# Patient Record
Sex: Male | Born: 1975 | Race: White | Hispanic: No | Marital: Married | Smoking: Never smoker
Health system: Southern US, Community
[De-identification: ages and names within clinical notes are randomized; demographics above are authoritative.]

## PROBLEM LIST (undated history)

## (undated) DIAGNOSIS — E079 Disorder of thyroid, unspecified: Secondary | ICD-10-CM

## (undated) DIAGNOSIS — I2510 Atherosclerotic heart disease of native coronary artery without angina pectoris: Secondary | ICD-10-CM

## (undated) DIAGNOSIS — F329 Major depressive disorder, single episode, unspecified: Secondary | ICD-10-CM

## (undated) DIAGNOSIS — F4310 Post-traumatic stress disorder, unspecified: Secondary | ICD-10-CM

## (undated) DIAGNOSIS — F32A Depression, unspecified: Secondary | ICD-10-CM

## (undated) HISTORY — PX: ACHILLES TENDON SURGERY: SHX542

---

## 2016-02-09 ENCOUNTER — Emergency Department (HOSPITAL_BASED_OUTPATIENT_CLINIC_OR_DEPARTMENT_OTHER)
Admission: EM | Admit: 2016-02-09 | Discharge: 2016-02-09 | Disposition: A | Discharge status: Home / Self Care | Attending: Emergency Medicine | Admitting: Emergency Medicine

## 2016-02-09 ENCOUNTER — Emergency Department (HOSPITAL_BASED_OUTPATIENT_CLINIC_OR_DEPARTMENT_OTHER)

## 2016-02-09 ENCOUNTER — Encounter (HOSPITAL_BASED_OUTPATIENT_CLINIC_OR_DEPARTMENT_OTHER): Payer: Self-pay | Admitting: *Deleted

## 2016-02-09 DIAGNOSIS — Y939 Activity, unspecified: Secondary | ICD-10-CM | POA: Insufficient documentation

## 2016-02-09 DIAGNOSIS — M79672 Pain in left foot: Secondary | ICD-10-CM | POA: Insufficient documentation

## 2016-02-09 DIAGNOSIS — F329 Major depressive disorder, single episode, unspecified: Secondary | ICD-10-CM | POA: Diagnosis not present

## 2016-02-09 DIAGNOSIS — Z7982 Long term (current) use of aspirin: Secondary | ICD-10-CM | POA: Diagnosis not present

## 2016-02-09 DIAGNOSIS — I2510 Atherosclerotic heart disease of native coronary artery without angina pectoris: Secondary | ICD-10-CM | POA: Insufficient documentation

## 2016-02-09 DIAGNOSIS — M79605 Pain in left leg: Secondary | ICD-10-CM | POA: Diagnosis present

## 2016-02-09 DIAGNOSIS — M25572 Pain in left ankle and joints of left foot: Secondary | ICD-10-CM | POA: Diagnosis not present

## 2016-02-09 DIAGNOSIS — Z79899 Other long term (current) drug therapy: Secondary | ICD-10-CM | POA: Insufficient documentation

## 2016-02-09 DIAGNOSIS — Y929 Unspecified place or not applicable: Secondary | ICD-10-CM | POA: Insufficient documentation

## 2016-02-09 DIAGNOSIS — W010XXA Fall on same level from slipping, tripping and stumbling without subsequent striking against object, initial encounter: Secondary | ICD-10-CM | POA: Insufficient documentation

## 2016-02-09 DIAGNOSIS — Y999 Unspecified external cause status: Secondary | ICD-10-CM | POA: Insufficient documentation

## 2016-02-09 HISTORY — DX: Depression, unspecified: F32.A

## 2016-02-09 HISTORY — DX: Major depressive disorder, single episode, unspecified: F32.9

## 2016-02-09 HISTORY — DX: Atherosclerotic heart disease of native coronary artery without angina pectoris: I25.10

## 2016-02-09 HISTORY — DX: Disorder of thyroid, unspecified: E07.9

## 2016-02-09 MED ORDER — OXYCODONE-ACETAMINOPHEN 5-325 MG PO TABS
2.0000 | ORAL_TABLET | Freq: Once | ORAL | Status: AC
  Administered 2016-02-09: 2 via ORAL
  Filled 2016-02-09: qty 2

## 2016-02-09 NOTE — ED Notes (Signed)
D/c home with ride. Disc of imaging given to pt for f/u

## 2016-02-09 NOTE — ED Notes (Signed)
Surgery on his left foot a week ago. He slipped on a wet floor today. He fell. Pain in his left foot since the fall.

## 2016-02-09 NOTE — ED Provider Notes (Signed)
CSN: [id number]     Arrival date & time 02/09/16  1417 History   First MD Initiated Contact with Patient 02/09/16 1441     Chief Complaint  Patient presents with  . Fall  . Leg Pain     (Consider location/radiation/quality/duration/timing/severity/associated sxs/prior Treatment) Patient is a 40 y.o. male presenting with fall and leg pain. The history is provided by the patient and medical records.  Fall Associated symptoms include arthralgias.  Leg Pain  40 year old male with history of depression, thyroid disease, coronary artery disease, presenting to the ED for left foot pain. He had Achilles tendon repair 1 week ago secondary to chronic achilles tendonitis with heel spurs.  He states this was not in [place] where he lives. He is currently in [place] interviewing for jobs. He states is at [facility] today and slipped on a wet floor which caused him to bear weight on his left foot which she is not supposed to do yet. He states he has had persistent pain in his left foot since the fall. He denies numbness or weakness of his left foot. He is not taking any of his pain medication thus far today.  Past Medical History  Diagnosis Date  . Depression   . Thyroid disease   . CAD (coronary artery disease)    Past Surgical History  Procedure Laterality Date  . Achilles tendon surgery     No family history on file. Social History  Substance Use Topics  . Smoking status: Never Smoker   . Smokeless tobacco: None  . Alcohol Use: Yes     Comment: occ    Review of Systems  Musculoskeletal: Positive for arthralgias.  All other systems reviewed and are negative.     Allergies  Review of patient's allergies indicates no known allergies.  Home Medications   Prior to Admission medications   Medication Sig Start Date End Date Taking? Authorizing Provider  aspirin 81 MG tablet Take 81 mg by mouth daily.   Yes Historical Provider, MD  Levothyroxine Sodium (SYNTHROID PO) Take by  mouth.   Yes Historical Provider, MD  Oxycodone-Acetaminophen (PERCOCET PO) Take by mouth.   Yes Historical Provider, MD  Rosuvastatin Calcium (CRESTOR PO) Take by mouth.   Yes Historical Provider, MD  Venlafaxine HCl (EFFEXOR XR PO) Take by mouth.   Yes Historical Provider, MD   BP 130/92 mmHg  Pulse 103  Temp(Src) 98.1 F (36.7 C) (Oral)  Resp 18  Ht  (1.803 m)  Wt 120.203 kg  BMI 36.98 kg/m2  SpO2 99%   Physical Exam  Constitutional: He is oriented to person, place, and time. He appears well-developed and well-nourished.  HENT:  Head: Normocephalic and atraumatic.  Mouth/Throat: Oropharynx is clear and moist.  Eyes: Conjunctivae and EOM are normal. Pupils are equal, round, and reactive to light.  Neck: Normal range of motion.  Cardiovascular: Normal rate, regular rhythm and normal heart sounds.   Pulmonary/Chest: Effort normal and breath sounds normal.  Abdominal: Soft. Bowel sounds are normal.  Musculoskeletal: Normal range of motion.  Short leg posterior splint in place-- splint removed revealing posterior ankle with midline incision with vicryl sutures in place; no dehiscence of wound; mild swelling and bruising noted to medial left ankle and foot which appears old (likely from surgery); no deformities or bulges about the posterior ankle or distal posterior calf; pain with any ROM of left ankle; DP pulse intact; normal cap refill, normal sensation throughout  Neurological: He is alert and oriented  to person, place, and time.  Skin: Skin is warm and dry.  Psychiatric: He has a normal mood and affect.  Nursing note and vitals reviewed.   ED Course  ORTHOPEDIC INJURY TREATMENT Date/Time: 02/09/2016 4:08 PM Performed by: Garlon HatchetSANDERS, LISA M Authorized by: Garlon HatchetSANDERS, LISA M Consent: Verbal consent obtained. Risks and benefits: risks, benefits and alternatives were discussed Consent given by: patient Patient understanding: patient states understanding of the procedure being  performed Required items: required blood products, implants, devices, and special equipment available Patient identity confirmed: verbally with patient Injury location: ankle Location details: left ankle Injury type: soft tissue Pre-procedure neurovascular assessment: neurovascularly intact Immobilization: splint Splint type: short leg Supplies used: cotton padding,  elastic bandage and Ortho-Glass Post-procedure neurovascular assessment: post-procedure neurovascularly intact Patient tolerance: Patient tolerated the procedure well with no immediate complications   (including critical care time) Labs Review Labs Reviewed - No data to display  Imaging Review Dg Ankle Complete Left  02/09/2016  CLINICAL DATA:  Left ankle and foot pain after falling wet floor today. Left foot surgery 1 week ago. Initial encounter. EXAM: LEFT ANKLE COMPLETE - 3+ VIEW COMPARISON:  None. FINDINGS: As seen on the accompanying foot radiographs, there is a linear density along the superior aspect of the calcaneal tuberosity which may related to the patient's reported recent surgery. No typical acute fracture or dislocation. The soft tissues around the ankle are mildly prominent. IMPRESSION: No suspected acute osseous findings. Linear density superior to the calcaneal tuberosity, possibly related to recent surgery. Electronically Signed   By: Carey BullocksWilliam  Veazey M.D.   On: 02/09/2016 15:47   Dg Foot Complete Left  02/09/2016  CLINICAL DATA:  Left ankle and foot pain after falling wet floor today. Left foot surgery 1 week ago. Initial encounter. EXAM: LEFT FOOT - COMPLETE 3+ VIEW COMPARISON:  None. FINDINGS: The mineralization and alignment are normal. There is an indeterminate small linear density along the superior aspect of the calcaneal tuberosity which may be related to the patient's reported recent surgery. No typical acute fracture or dislocation. There appears to be mild soft tissue swelling in the dorsum of the  forefoot. IMPRESSION: No suspected acute findings. Linear density superior to the calcaneal tuberosity may be related to the patient's reported recent surgery. Correlate clinically. Electronically Signed   By: Carey BullocksWilliam  Veazey M.D.   On: 02/09/2016 15:46   I have personally reviewed and evaluated these images and lab results as part of my medical decision-making.   EKG Interpretation None      MDM   Final diagnoses:  Left ankle pain  Left foot pain   40 year old male here with left foot pain. He had an Achilles tendon repair 1 week ago with his orthopedic surgeon in [place]. [place] physical or be jobs. He accidentally stepped down on his left foot today while [facility] [employer] [facility] [employer] after slipping and some water. No head injury loss of consciousness. Reports increased pain in his left ankle/heel. He rides in a short-leg posterior splint which was removed. His posterior incision and sutures were pain well approximated, no dehiscence. He does have some swelling and bruising noted which is old and likely from his surgery. He has pain with any attempted range of motion of his left ankle. There is no deformity of the posterior ankle or distal calf. Screening x-rays of left ankle and left foot obtained, no acute findings noted. Patient was placed in a new posterior splint. He has previously scheduled follow-up  with his orthopedist later this week once he returns to [place].  Images copied onto disc and given to patient for physician review.  Discussed plan with patient, he/she acknowledged understanding and agreed with plan of care.  Return precautions given for new or worsening symptoms.  Garlon Hatchet, PA-C 02/09/16 1621  Azalia Bilis, MD 02/10/16 [phone]

## 2016-02-09 NOTE — ED Notes (Signed)
Xray made aware that pt needs a CD of his imaging to take with him at discharge

## 2016-02-09 NOTE — Discharge Instructions (Signed)
Continue your home pain medications as directed.  Follow-up with your orthopedist on Thursday as scheduled.  Your x-rays were uploaded to disc for his review. Return to the ED for new or worsening symptoms.

## 2016-11-10 IMAGING — CR DG ANKLE COMPLETE 3+V*L*
3 series · 3 of 3 positions shown · non-contrast
Comparison: None.

CLINICAL DATA: Left ankle and foot pain after falling wet floor
today. Left foot surgery 1 week ago. Initial encounter.

EXAM:
LEFT ANKLE COMPLETE - 3+ VIEW

[t ankle joint lat left]
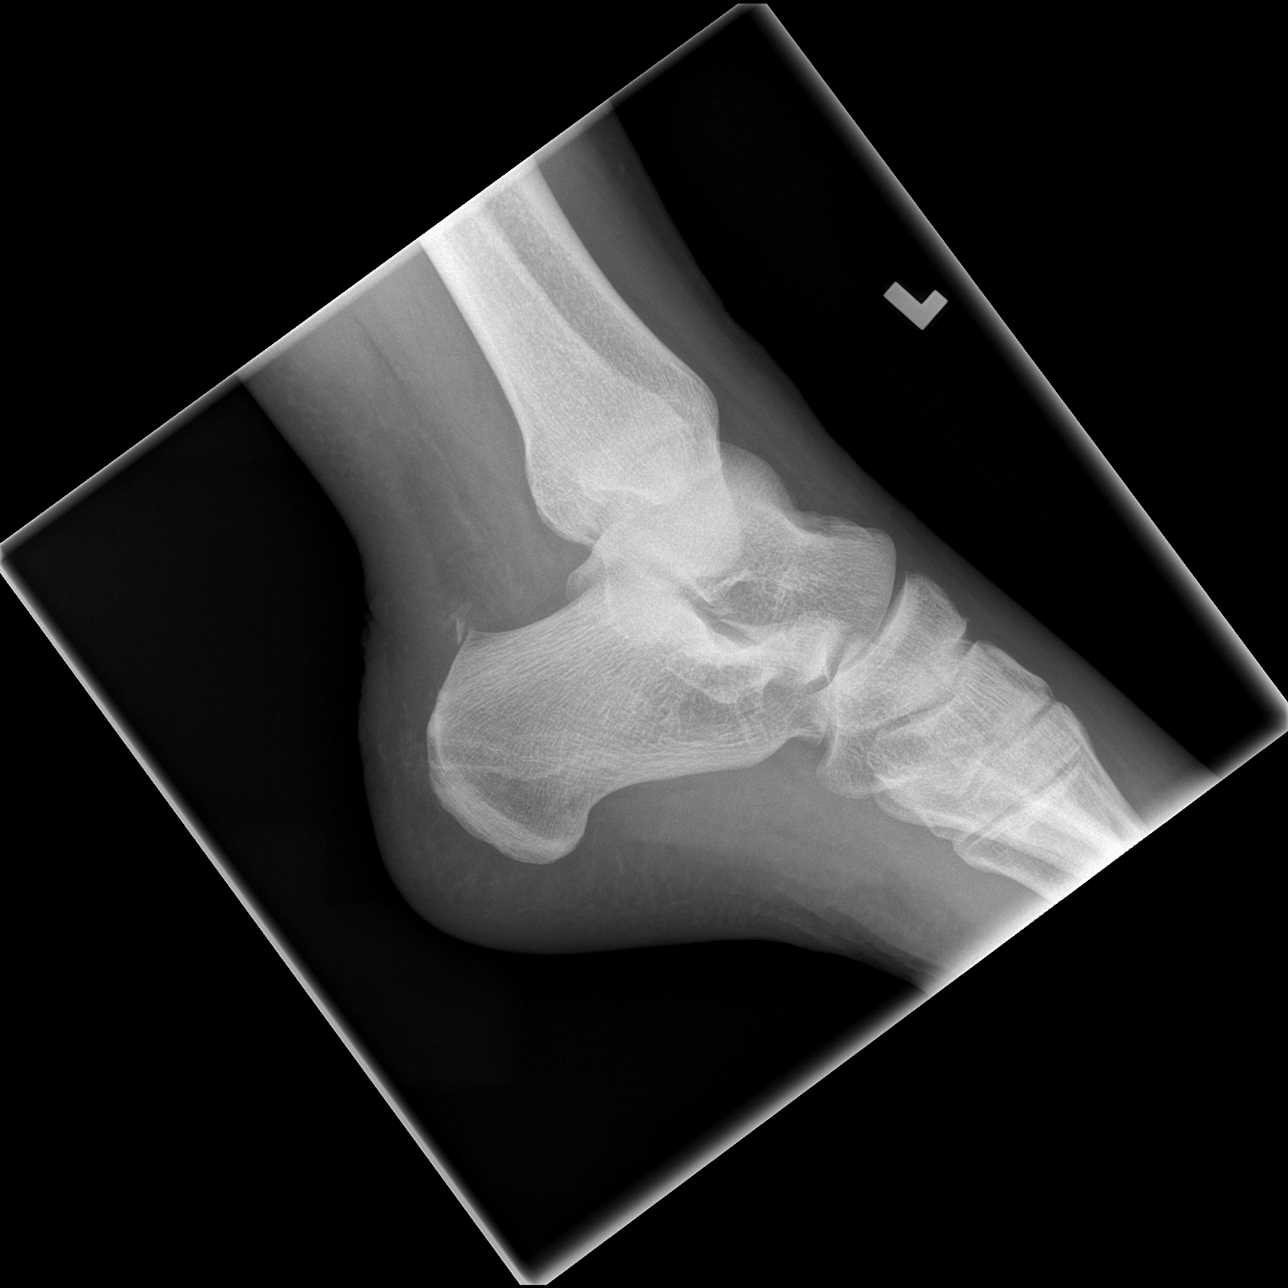

[t ankle joint ap left]
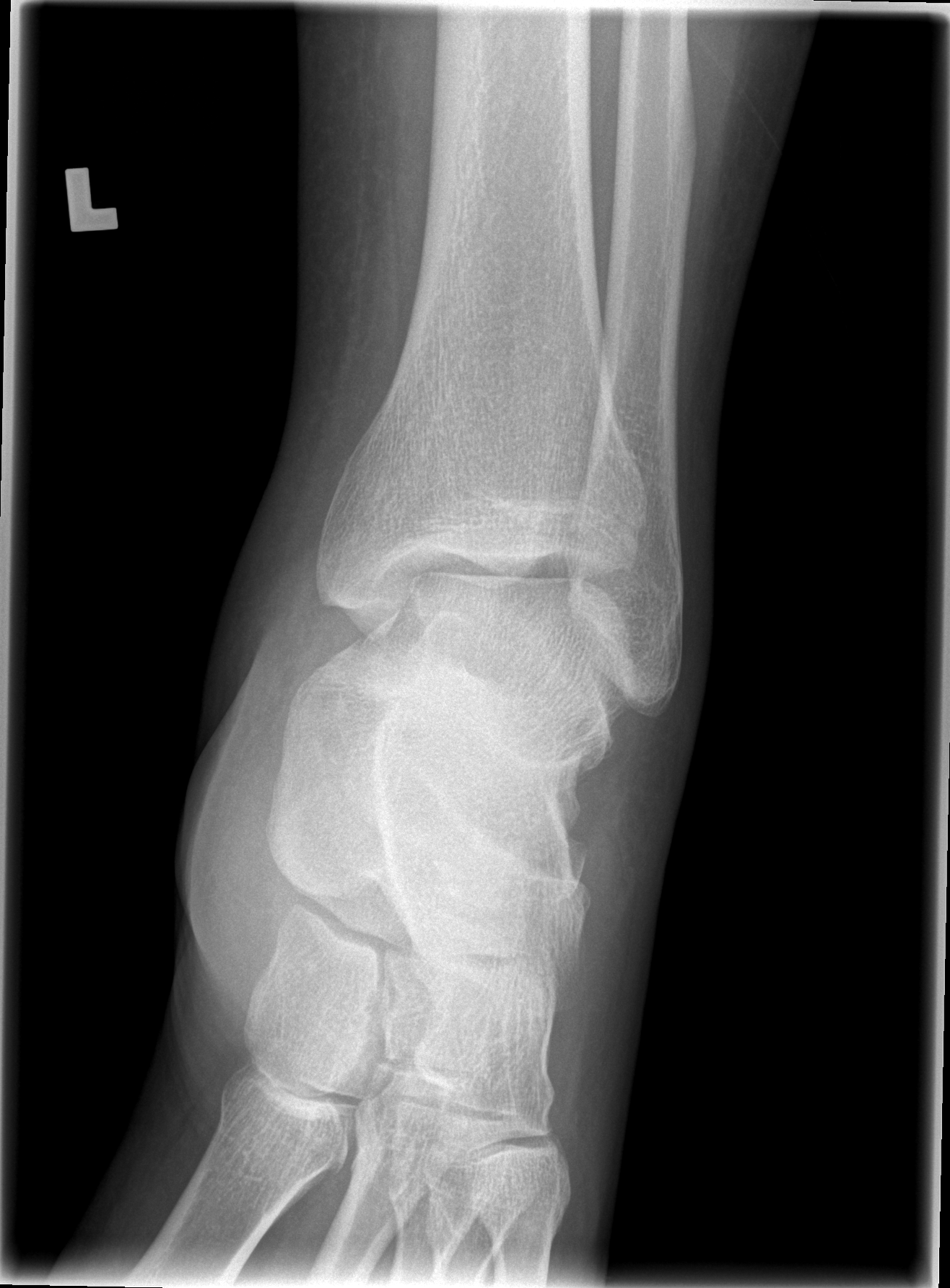

[t ankle joint oblique left]
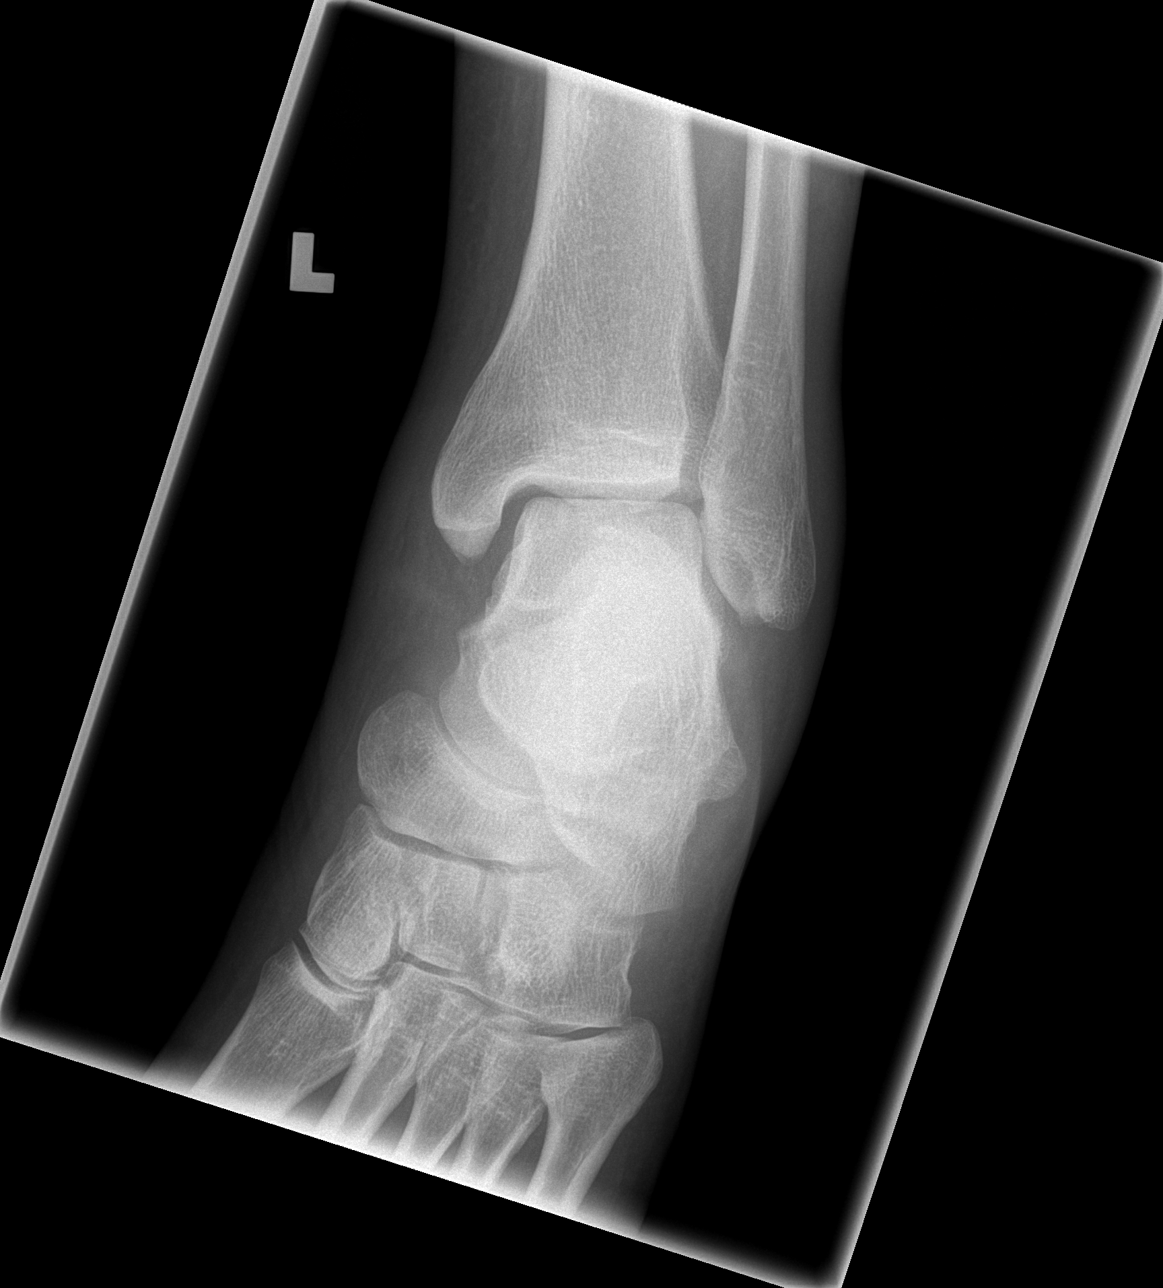

[3 of 3 positions shown; findings below may reference images not displayed]

FINDINGS: As seen on the accompanying foot radiographs, there is a linear
density along the superior aspect of the calcaneal tuberosity which
may related to the patient's reported recent surgery. No typical
acute fracture or dislocation. The soft tissues around the ankle are
mildly prominent.
IMPRESSION: No suspected acute osseous findings. Linear density superior to the
calcaneal tuberosity, possibly related to recent surgery.

## 2018-02-01 ENCOUNTER — Other Ambulatory Visit: Payer: Self-pay

## 2018-02-01 ENCOUNTER — Encounter (HOSPITAL_COMMUNITY): Payer: Self-pay

## 2018-02-01 ENCOUNTER — Emergency Department (HOSPITAL_COMMUNITY)

## 2018-02-01 ENCOUNTER — Encounter (HOSPITAL_COMMUNITY): Payer: Self-pay | Admitting: Emergency Medicine

## 2018-02-01 ENCOUNTER — Inpatient Hospital Stay (HOSPITAL_COMMUNITY)
Admission: AD | Admit: 2018-02-01 | Discharge: 2018-02-06 | DRG: 885 | Disposition: A | Discharge status: Home / Self Care | Source: Intra-hospital | Attending: Psychiatry | Admitting: Psychiatry

## 2018-02-01 ENCOUNTER — Emergency Department (HOSPITAL_COMMUNITY)
Admission: EM | Admit: 2018-02-01 | Discharge: 2018-02-01 | Disposition: A | Discharge status: Other Acute Inpatient Hospital | Attending: Emergency Medicine | Admitting: Emergency Medicine

## 2018-02-01 DIAGNOSIS — R45851 Suicidal ideations: Secondary | ICD-10-CM | POA: Diagnosis present

## 2018-02-01 DIAGNOSIS — R7401 Elevation of levels of liver transaminase levels: Secondary | ICD-10-CM

## 2018-02-01 DIAGNOSIS — F322 Major depressive disorder, single episode, severe without psychotic features: Secondary | ICD-10-CM | POA: Insufficient documentation

## 2018-02-01 DIAGNOSIS — G4700 Insomnia, unspecified: Secondary | ICD-10-CM | POA: Diagnosis not present

## 2018-02-01 DIAGNOSIS — I2510 Atherosclerotic heart disease of native coronary artery without angina pectoris: Secondary | ICD-10-CM | POA: Diagnosis not present

## 2018-02-01 DIAGNOSIS — F419 Anxiety disorder, unspecified: Secondary | ICD-10-CM | POA: Diagnosis not present

## 2018-02-01 DIAGNOSIS — R740 Nonspecific elevation of levels of transaminase and lactic acid dehydrogenase [LDH]: Secondary | ICD-10-CM | POA: Diagnosis not present

## 2018-02-01 DIAGNOSIS — F4310 Post-traumatic stress disorder, unspecified: Secondary | ICD-10-CM | POA: Diagnosis present

## 2018-02-01 DIAGNOSIS — F4010 Social phobia, unspecified: Secondary | ICD-10-CM | POA: Diagnosis not present

## 2018-02-01 DIAGNOSIS — R450 Nervousness: Secondary | ICD-10-CM | POA: Diagnosis not present

## 2018-02-01 DIAGNOSIS — Z79899 Other long term (current) drug therapy: Secondary | ICD-10-CM | POA: Diagnosis not present

## 2018-02-01 DIAGNOSIS — F1099 Alcohol use, unspecified with unspecified alcohol-induced disorder: Secondary | ICD-10-CM | POA: Diagnosis not present

## 2018-02-01 DIAGNOSIS — Z811 Family history of alcohol abuse and dependence: Secondary | ICD-10-CM | POA: Diagnosis not present

## 2018-02-01 DIAGNOSIS — E039 Hypothyroidism, unspecified: Secondary | ICD-10-CM | POA: Diagnosis not present

## 2018-02-01 HISTORY — DX: Post-traumatic stress disorder, unspecified: F43.10

## 2018-02-01 LAB — RAPID URINE DRUG SCREEN, HOSP PERFORMED
AMPHETAMINES: NOT DETECTED
Barbiturates: NOT DETECTED
Benzodiazepines: NOT DETECTED
COCAINE: NOT DETECTED
OPIATES: NOT DETECTED
Tetrahydrocannabinol: NOT DETECTED

## 2018-02-01 LAB — COMPREHENSIVE METABOLIC PANEL
ALT: 87 U/L — ABNORMAL HIGH (ref 17–63)
ANION GAP: 13 (ref 5–15)
AST: 42 U/L — AB (ref 15–41)
Albumin: 4.6 g/dL (ref 3.5–5.0)
Alkaline Phosphatase: 77 U/L (ref 38–126)
BILIRUBIN TOTAL: 0.9 mg/dL (ref 0.3–1.2)
BUN: 17 mg/dL (ref 6–20)
CHLORIDE: 108 mmol/L (ref 101–111)
CO2: 20 mmol/L — ABNORMAL LOW (ref 22–32)
Calcium: 9.5 mg/dL (ref 8.9–10.3)
Creatinine, Ser: 1.02 mg/dL (ref 0.61–1.24)
Glucose, Bld: 97 mg/dL (ref 65–99)
POTASSIUM: 3.8 mmol/L (ref 3.5–5.1)
Sodium: 141 mmol/L (ref 135–145)
TOTAL PROTEIN: 8.4 g/dL — AB (ref 6.5–8.1)

## 2018-02-01 LAB — URINALYSIS, ROUTINE W REFLEX MICROSCOPIC
Bilirubin Urine: NEGATIVE
Glucose, UA: NEGATIVE mg/dL
Hgb urine dipstick: NEGATIVE
Ketones, ur: 5 mg/dL — AB
Leukocytes, UA: NEGATIVE
Nitrite: NEGATIVE
PROTEIN: NEGATIVE mg/dL
SPECIFIC GRAVITY, URINE: 1.023 (ref 1.005–1.030)
pH: 5 (ref 5.0–8.0)

## 2018-02-01 LAB — CBC
HCT: 48.5 % (ref 39.0–52.0)
Hemoglobin: 17.6 g/dL — ABNORMAL HIGH (ref 13.0–17.0)
MCH: 32.1 pg (ref 26.0–34.0)
MCHC: 36.3 g/dL — ABNORMAL HIGH (ref 30.0–36.0)
MCV: 88.3 fL (ref 78.0–100.0)
PLATELETS: 236 10*3/uL (ref 150–400)
RBC: 5.49 MIL/uL (ref 4.22–5.81)
RDW: 13.1 % (ref 11.5–15.5)
WBC: 8.1 10*3/uL (ref 4.0–10.5)

## 2018-02-01 LAB — SALICYLATE LEVEL

## 2018-02-01 LAB — ETHANOL

## 2018-02-01 LAB — ACETAMINOPHEN LEVEL

## 2018-02-01 MED ORDER — ALUM & MAG HYDROXIDE-SIMETH 200-200-20 MG/5ML PO SUSP: 30.0000 mL | ORAL | Status: DC | PRN

## 2018-02-01 MED ORDER — HYDROXYZINE HCL 50 MG PO TABS
50.0000 mg | ORAL_TABLET | Freq: Three times a day (TID) | ORAL | Status: DC
  Administered 2018-02-02 (×2): 50 mg via ORAL
  Filled 2018-02-01 (×6): qty 1

## 2018-02-01 MED ORDER — ACETAMINOPHEN 325 MG PO TABS: 650.0000 mg | ORAL_TABLET | Freq: Four times a day (QID) | ORAL | Status: DC | PRN

## 2018-02-01 MED ORDER — MAGNESIUM HYDROXIDE 400 MG/5ML PO SUSP: 30.0000 mL | Freq: Every day | ORAL | Status: DC | PRN

## 2018-02-01 MED ORDER — TRAZODONE HCL 50 MG PO TABS
50.0000 mg | ORAL_TABLET | Freq: Every evening | ORAL | Status: DC | PRN
  Administered 2018-02-02: 50 mg via ORAL
  Filled 2018-02-01 (×2): qty 1

## 2018-02-01 MED ORDER — IBUPROFEN 600 MG PO TABS: 600.0000 mg | ORAL_TABLET | Freq: Three times a day (TID) | ORAL | Status: DC | PRN

## 2018-02-01 MED ORDER — IBUPROFEN 200 MG PO TABS: 600.0000 mg | ORAL_TABLET | Freq: Three times a day (TID) | ORAL | Status: DC | PRN

## 2018-02-01 MED ORDER — LOPERAMIDE HCL 2 MG PO CAPS
4.0000 mg | ORAL_CAPSULE | Freq: Once | ORAL | Status: AC
  Administered 2018-02-01: 4 mg via ORAL
  Filled 2018-02-01: qty 2

## 2018-02-01 MED ORDER — HYDROXYZINE HCL 25 MG PO TABS
50.0000 mg | ORAL_TABLET | Freq: Three times a day (TID) | ORAL | Status: DC
  Administered 2018-02-01: 50 mg via ORAL
  Filled 2018-02-01: qty 2

## 2018-02-01 MED ORDER — ALUM & MAG HYDROXIDE-SIMETH 200-200-20 MG/5ML PO SUSP: 30.0000 mL | Freq: Four times a day (QID) | ORAL | Status: DC | PRN

## 2018-02-01 MED ORDER — ONDANSETRON HCL 4 MG PO TABS: 4.0000 mg | ORAL_TABLET | Freq: Three times a day (TID) | ORAL | Status: DC | PRN

## 2018-02-01 MED ORDER — LOPERAMIDE HCL 2 MG PO CAPS: 2.0000 mg | ORAL_CAPSULE | Freq: Four times a day (QID) | ORAL | Status: DC | PRN

## 2018-02-01 MED ORDER — LOPERAMIDE HCL 2 MG PO CAPS: 2.0000 mg | ORAL_CAPSULE | Freq: Four times a day (QID) | ORAL | Status: AC | PRN

## 2018-02-01 NOTE — ED Triage Notes (Signed)
Pt brought in by [place] counselor for SI and PTSD that gotten worse over past week and half. Pt plan to shoot himself but doesn't have access to gun per counselor.

## 2018-02-01 NOTE — Progress Notes (Signed)
Richard Lara is a 42 y.o. male Voluntary admitted for suicide ideation with a plan to shoot himself from [facility].  Pt is very fugitive, restless and could not concentrate. Pt stated " I am unable to answer any question right now because  I am having hard time concentrating". Pt did allow staff to do skin search. Pt was walked to the unit and shown his room, fluids and toiletry given. Will continue to monitor.

## 2018-02-01 NOTE — BH Assessment (Signed)
Assessment Note  Richard Lara is an 42 y.o. male. Pt reports SI with a plan to shoot himself. Pt denies HI and AVH. Pt denies previous SI attempts. Pt reports 2 previous hospitalizations for SI. Pt states he has been diagnosed with PTSD but he not receiving mental health treatment. Pt is not prescribed mental health medications. Pt denies SA. Pt states he has support from his wife and 2 children.  Pt is seen at the [facility] for primary care needs.  Dr. Sharma Covert and Jacki Cones, NP recommend inpatient treatment. Pt accepted to [facility].  Diagnosis:  F33.2 MDD  Past Medical History:  Past Medical History:  Diagnosis Date  . CAD (coronary artery disease)   . Depression   . PTSD (post-traumatic stress disorder)   . Thyroid disease     Past Surgical History:  Procedure Laterality Date  . ACHILLES TENDON SURGERY      Family History: No family history on file.  Social History:  reports that he has never smoked. He has never used smokeless tobacco. He reports that he drinks alcohol. He reports that he does not use drugs.  Additional Social History:  Alcohol / Drug Use Pain Medications: please see mar Prescriptions: please see mar Over the Counter: please see mar History of alcohol / drug use?: No history of alcohol / drug abuse Longest period of sobriety (when/how long): NA  CIWA: CIWA-Ar BP: (!) 132/100 Pulse Rate: 76 COWS:    Allergies: No Known Allergies  Home Medications:  (Not in a hospital admission)  OB/GYN Status:  No LMP for male patient.  General Assessment Data Location of Assessment: [facility] ED TTS Assessment: In system Is this a Tele or Face-to-Face Assessment?: Face-to-Face Is this an Initial Assessment or a Re-assessment for this encounter?: Initial Assessment Marital status: Married [place] name: NA Is patient pregnant?: No Pregnancy Status: No Living Arrangements: Spouse/significant other, Children Can pt return to current living arrangement?: Yes Admission Status:  Voluntary Is patient capable of signing voluntary admission?: Yes Referral Source: Self/Family/Friend Insurance type: Tricare     Crisis Care Plan Living Arrangements: Spouse/significant other, Children Legal Guardian: Other:(self) Name of Psychiatrist: NA Name of Therapist: NA  Education Status Is patient currently in school?: No Is the patient employed, unemployed or receiving disability?: Employed  Risk to self with the past 6 months Suicidal Ideation: Yes-Currently Present Has patient been a risk to self within the past 6 months prior to admission? : Yes Suicidal Intent: Yes-Currently Present Has patient had any suicidal intent within the past 6 months prior to admission? : Yes Is patient at risk for suicide?: Yes Suicidal Plan?: Yes-Currently Present Has patient had any suicidal plan within the past 6 months prior to admission? : Yes Specify Current Suicidal Plan: to shoot himself Access to Means: Yes Specify Access to Suicidal Means: Pt states he will find one What has been your use of drugs/alcohol within the last 12 months?: NA Previous Attempts/Gestures: No How many times?: 0 Other Self Harm Risks: NA Triggers for Past Attempts: None known Intentional Self Injurious Behavior: None Family Suicide History: No Recent stressful life event(s): Other (Comment)(work issues) Persecutory voices/beliefs?: No Depression: Yes Depression Symptoms: Insomnia, Tearfulness, Isolating, Loss of interest in usual pleasures, Feeling worthless/self pity, Feeling angry/irritable Substance abuse history and/or treatment for substance abuse?: No Suicide prevention information given to non-admitted patients: Yes  Risk to Others within the past 6 months Homicidal Ideation: No Does patient have any lifetime risk of violence toward others beyond the six months  prior to admission? : No Thoughts of Harm to Others: No Current Homicidal Intent: No Current Homicidal Plan: No Access to  Homicidal Means: No Identified Victim: NA History of harm to others?: No Assessment of Violence: None Noted Violent Behavior Description: NA Does patient have access to weapons?: No Criminal Charges Pending?: No Does patient have a court date: No Is patient on probation?: No  Psychosis Hallucinations: None noted Delusions: None noted  Mental Status Report Appearance/Hygiene: Unremarkable Eye Contact: Fair Motor Activity: Freedom of movement Speech: Logical/coherent Level of Consciousness: Alert Mood: Depressed Affect: Depressed Anxiety Level: None Thought Processes: Coherent, Relevant Judgement: Unimpaired Orientation: Person, Place, Time, Situation Obsessive Compulsive Thoughts/Behaviors: None  Cognitive Functioning Concentration: Normal Memory: Recent Intact, Remote Intact Is patient IDD: No Is patient DD?: No Insight: Fair Impulse Control: Fair Appetite: Poor Have you had any weight changes? : No Change Sleep: Decreased Total Hours of Sleep: 5 Vegetative Symptoms: None  ADLScreening [facility] Assessment Services) Patient's cognitive ability adequate to safely complete daily activities?: Yes Patient able to express need for assistance with ADLs?: Yes Independently performs ADLs?: Yes (appropriate for developmental age)  Prior Inpatient Therapy Prior Inpatient Therapy: Yes Prior Therapy Dates: 2009, 2016 Prior Therapy Facilty/Provider(s): can't remember Reason for Treatment: depression  Prior Outpatient Therapy Prior Outpatient Therapy: Yes Prior Therapy Dates: can't recall Prior Therapy Facilty/Provider(s): can't recall Reason for Treatment: depression Does patient have an ACCT team?: No Does patient have Intensive In-House Services?  : No Does patient have [facility] services? : No Does patient have P4CC services?: No  ADL Screening (condition at time of admission) Patient's cognitive ability adequate to safely complete daily activities?: Yes Is the patient  deaf or have difficulty hearing?: No Does the patient have difficulty seeing, even when wearing glasses/contacts?: No Does the patient have difficulty concentrating, remembering, or making decisions?: No Patient able to express need for assistance with ADLs?: Yes Does the patient have difficulty dressing or bathing?: No Independently performs ADLs?: Yes (appropriate for developmental age) Does the patient have difficulty walking or climbing stairs?: No       Abuse/Neglect Assessment (Assessment to be complete while patient is alone) Abuse/Neglect Assessment Can Be Completed: Yes Physical Abuse: Denies Verbal Abuse: Denies Sexual Abuse: Denies Exploitation of patient/patient's resources: Denies     Merchant navy officer (For Healthcare) Does Patient Have a Medical Advance Directive?: No Would patient like information on creating a medical advance directive?: No - Patient declined    Additional Information 1:1 In Past 12 Months?: No CIRT Risk: No Elopement Risk: No Does patient have medical clearance?: Yes     Disposition:  Disposition Initial Assessment Completed for this Encounter: Yes Disposition of Patient: Admit Type of inpatient treatment program: Adult Patient refused recommended treatment: No  On Site Evaluation by:   Reviewed with Physician:    Emmit Pomfret 02/01/2018 1:36 PM

## 2018-02-01 NOTE — BH Assessment (Signed)
[facility] Assessment Progress Note  Per Juanetta Beets, DO, this pt requires psychiatric hospitalization at this time.  Malva Limes, RN, [facility] assigned pt to [facility] Rm [id number]; [facility] will be ready to receive pt at 16:30.  Pt has signed Voluntary Admission and Consent for Treatment, as well as Consent to Release Information to the [facility], and a notification call has been placed.  Signed forms have been faxed to [facility].  Pt's nurse, Lincoln Maxin, has been notified, and agrees to send original paperwork along with pt via Juel Burrow, and to call report to [phone].  Doylene Canning, [place] Behavioral Health Coordinator [phone]

## 2018-02-01 NOTE — ED Provider Notes (Addendum)
[facility]-EMERGENCY DEPT Provider Note   CSN: [id number] Arrival date & [id number]  1004     History   Chief Complaint Chief Complaint  Patient presents with  . Suicidal  . Post-Traumatic Stress Disorder    HPI Richard Lara is a 42 y.o. male with a history of thyroid disease and bipolar disorder who presents to the emergency department with suicidal ideation and plan.  He is accompanied by a counselor from the [facility] who reports that the patient called a suicide hotline expressing suicidal ideation with intent to shoot himself with a gun.  The patient states that he does not have access to a gun in his home.  The [place] counselor reports that the patient was scheduled for a follow-up appointment with a counselor through their facility for tomorrow.  He reports worsening depressed mood, decreased appetite, insomnia, and psychomotor retardation for the last 1.5 weeks.  He reports that he has intermittently struggled with suicidal ideation over the last few years, but has had no history of suicide attempts.  He denies HI or auditory visual hallucinations.  He has a history of 2 previous inpatient stays around the [place] area and to partial inpatient stays.  During one of these stays, he was diagnosed with bipolar disorder, and was started on Zoloft.  He states this was later changed to Wellbutrin which did not do a good job of managing his highs and lows.  He was switched to several other medications, but his last medication was Klonopin.  He states that he was on the highest dose, but stopped taking the medication around 1.5 years ago.  He reports 2-3 alcoholic beverages 2 to 3 days/week.  He reports this is drastically decreased from when he was discharged from the service 8 to 9 years ago.  He served in [place].  He is a non-smoker.  He denies any recreational or illicit drug use.  He reports that he was diagnosed with thyroid  disease several years ago and took levothyroxine for 6 to 7 years.  This medication was discontinued several years ago by his PCP.  He also endorses chronic nonbloody diarrhea with associated nausea.  He states that he has multiple episodes of diarrhea daily, which is mildly increased over the last few weeks.  He denies abdominal pain, vomiting, fever, chills, chest pain, dyspnea, dysuria, back pain, or rash.  Surgical history includes cholecystectomy.  The history is provided by the patient. No language interpreter was used.    Past Medical History:  Diagnosis Date  . CAD (coronary artery disease)   . Depression   . PTSD (post-traumatic stress disorder)   . Thyroid disease     There are no active problems to display for this patient.   Past Surgical History:  Procedure Laterality Date  . ACHILLES TENDON SURGERY          Home Medications    Prior to Admission medications   Medication Sig Start Date End Date Taking? Authorizing Provider  ibuprofen (ADVIL,MOTRIN) 200 MG tablet Take 800 mg by mouth every 6 (six) hours as needed for headache or mild pain.   Yes [provider]    Family History No family history on file.  Social History Social History   Tobacco Use  . Smoking status: Never Smoker  . Smokeless tobacco: Never Used  Substance Use Topics  . Alcohol use: Yes  . Drug use: No     Allergies   Patient  has no known allergies.   Review of Systems Review of Systems  Constitutional: Negative for appetite change and fever.  HENT: Negative for congestion.   Respiratory: Negative for shortness of breath.   Cardiovascular: Negative for chest pain.  Gastrointestinal: Positive for diarrhea (chronic) and nausea. Negative for abdominal distention, abdominal pain, anal bleeding, blood in stool and vomiting.  Genitourinary: Negative for dysuria.  Musculoskeletal: Negative for back pain.  Skin: Negative for rash.  Allergic/Immunologic: Negative for  immunocompromised state.  Neurological: Negative for headaches.  Psychiatric/Behavioral: Positive for dysphoric mood, sleep disturbance and suicidal ideas. Negative for behavioral problems, confusion and hallucinations. The patient is nervous/anxious.      Physical Exam Updated Vital Signs BP (!) 132/100 (BP Location: Left Arm)   Pulse 76   Temp 97.7 F (36.5 C) (Oral)   Resp 19   SpO2 100%   Physical Exam  Constitutional: He is oriented to person, place, and time. He appears well-developed.  HENT:  Head: Normocephalic.  Eyes: Conjunctivae are normal.  Neck: Neck supple.  Cardiovascular: Normal rate, regular rhythm, normal heart sounds and intact distal pulses. Exam reveals no gallop and no friction rub.  No murmur heard. Pulmonary/Chest: Effort normal. No stridor. No respiratory distress. He has no wheezes. He has no rales. He exhibits no tenderness.  Abdominal: Soft. Bowel sounds are normal. He exhibits no distension and no mass. There is tenderness. There is no rebound and no guarding. No hernia.  Mild diffuse tenderness throughout the abdomen.  Abdomen is soft, nondistended.  No CVA tenderness bilaterally.  Negative Murphy sign.  No peritoneal signs.  Musculoskeletal: Normal range of motion. He exhibits no edema, tenderness or deformity.  Neurological: He is alert and oriented to person, place, and time.  Skin: Skin is warm and dry.  Psychiatric: His speech is normal. Judgment normal. He is slowed. He is not actively hallucinating. Thought content is not paranoid and not delusional. Cognition and memory are normal. He exhibits a depressed mood. He expresses suicidal ideation. He expresses no homicidal ideation. He expresses suicidal plans. He expresses no homicidal plans.  Nursing note and vitals reviewed.    ED Treatments / Results  Labs (all labs ordered are listed, but only abnormal results are displayed) Labs Reviewed  COMPREHENSIVE METABOLIC PANEL - Abnormal; Notable  for the following components:      Result Value   CO2 20 (*)    Total Protein 8.4 (*)    AST 42 (*)    ALT 87 (*)    All other components within normal limits  ACETAMINOPHEN LEVEL - Abnormal; Notable for the following components:   Acetaminophen (Tylenol), Serum <10 (*)    All other components within normal limits  CBC - Abnormal; Notable for the following components:   Hemoglobin 17.6 (*)    MCHC 36.3 (*)    All other components within normal limits  URINALYSIS, ROUTINE W REFLEX MICROSCOPIC - Abnormal; Notable for the following components:   APPearance TURBID (*)    Ketones, ur 5 (*)    Bacteria, UA RARE (*)    All other components within normal limits  ETHANOL  SALICYLATE LEVEL  RAPID URINE DRUG SCREEN, HOSP PERFORMED    EKG None  Radiology No results found.  Procedures Procedures (including critical care time)  Medications Ordered in ED Medications  ibuprofen (ADVIL,MOTRIN) tablet 600 mg (has no administration in time range)  ondansetron (ZOFRAN) tablet 4 mg (has no administration in time range)  alum & mag hydroxide-simeth (MAALOX/MYLANTA)  200-200-20 MG/5ML suspension 30 mL (has no administration in time range)  hydrOXYzine (ATARAX/VISTARIL) tablet 50 mg (50 mg Oral Given 02/01/18 1507)  loperamide (IMODIUM) capsule 2 mg (has no administration in time range)  loperamide (IMODIUM) capsule 4 mg (4 mg Oral Given 02/01/18 1202)     Initial Impression / Assessment and Plan / ED Course  I have reviewed the triage vital signs and the nursing notes.  Pertinent labs & imaging results that were available during my care of the patient were reviewed by me and considered in my medical decision making (see chart for details).  Clinical Course as of Feb 02 1603  Wed Feb 01, 2018  1537 Patient rechecked.  He has had another episode of diarrhea.  Will  order as needed loperamide.  Discussed elevated transaminases on labs.  Patient expressed concerns about elevated labs and his  lack of follow-up with primary care at the [place].  Right upper quadrant ultrasound has been ordered.   [MM]  1604 Spoke with PA Fawze.  She will follow-up on right upper quadrant ultrasound results with patient.   [MM]    Clinical Course User Index [MM] Markeisha Mancias A, PA-C    42 year old male with a history of thyroid disease and bipolar disorder presenting with suicidal ideation and plan.  He also endorses depressed mood, insomnia, and poor appetite for the last few weeks.  Labs notable for AST to ALT 2:1 elevation (42:87), likely secondary to alcohol use, but ALT is greater than AST, which is unusual.  Hemoglobin is minimally elevated at 17.6, likely secondary to mild dehydration.  Loperamide given for diarrhea and Zofran for nausea.  Pt medically cleared at this time. Psych hold orders and PRN orders placed. TTS consulted; please see psych team notes for further documentation of care. Pt stable at time of med clearance.    Spoke with Tom with TTS who reports that the patient has been recommended for inpatient treatment. The patient appears reasonably stabilized for admission and transport considering the current resources, flow, and capabilities available in the ED at this time, and I doubt any other [facility] requiring further screening and/or treatment in the ED prior to admission or transport.  Final Clinical Impressions(s) / ED Diagnoses   Final diagnoses:  Current severe episode of major depressive disorder without psychotic features without prior episode (HCC)  Elevated transaminase level    ED Discharge Orders    None       Barkley Boards, PA-C 02/01/18 1426    Tashi Band A, PA-C 02/01/18 1605    Tegeler, Canary Brim, MD 02/01/18 2008

## 2018-02-01 NOTE — ED Notes (Signed)
Bed: [facility]3 Expected date:  Expected time:  Means of arrival:  Comments: 

## 2018-02-01 NOTE — Tx Team (Signed)
Initial Treatment Plan 02/01/2018 9:01 PM Richard Lara ZOX:[id number]    PATIENT STRESSORS: Medication change or noncompliance Traumatic event   PATIENT STRENGTHS: General fund of knowledge Supportive family/friends   PATIENT IDENTIFIED PROBLEMS: "Depression"  "Anxiety"                    DISCHARGE CRITERIA:  Ability to meet basic life and health needs Improved stabilization in mood, thinking, and/or behavior Verbal commitment to aftercare and medication compliance  PRELIMINARY DISCHARGE PLAN: Attend PHP/IOP Outpatient therapy Return to previous living arrangement  PATIENT/FAMILY INVOLVEMENT: This treatment plan has been presented to and reviewed with the patient, Richard Lara. The patient have been given the opportunity to ask questions and make suggestions.  Tyrone Apple, RN 02/01/2018, 9:01 PM

## 2018-02-01 NOTE — Progress Notes (Addendum)
Pt A & O X4 and is logical on interactions. Per pt "I'm here because of PTSD and I've been thinking of harming myself". Pt Presents guarded with flat affect and depressed mood. Denies HI, AVH and physical pain / distress. Endorsed passive SI with plan to shoot self "that will be the easy and clean way out". States "I'm tired living like this up and down all the time, I want to integrate into society, I want to be comfortable with others". Pt is an Horticulturist, commercial. Reports medication noncompliance related to lack of primary care. Rates his depression 6/10 and anxiety 6/10. Unit orientation done and routines discussed with pt; understanding verbalized. Emotional support and availability provided to pt. Encouraged pt to voice concerns. Safety checks initiated.

## 2018-02-02 DIAGNOSIS — F4010 Social phobia, unspecified: Secondary | ICD-10-CM

## 2018-02-02 DIAGNOSIS — F322 Major depressive disorder, single episode, severe without psychotic features: Principal | ICD-10-CM

## 2018-02-02 DIAGNOSIS — Z811 Family history of alcohol abuse and dependence: Secondary | ICD-10-CM

## 2018-02-02 DIAGNOSIS — F419 Anxiety disorder, unspecified: Secondary | ICD-10-CM

## 2018-02-02 DIAGNOSIS — R450 Nervousness: Secondary | ICD-10-CM

## 2018-02-02 DIAGNOSIS — F1099 Alcohol use, unspecified with unspecified alcohol-induced disorder: Secondary | ICD-10-CM

## 2018-02-02 DIAGNOSIS — R45851 Suicidal ideations: Secondary | ICD-10-CM

## 2018-02-02 MED ORDER — LORAZEPAM 1 MG PO TABS
1.0000 mg | ORAL_TABLET | Freq: Three times a day (TID) | ORAL | Status: DC | PRN
  Filled 2018-02-02: qty 1

## 2018-02-02 MED ORDER — ARIPIPRAZOLE 2 MG PO TABS
2.0000 mg | ORAL_TABLET | Freq: Every day | ORAL | Status: DC
  Administered 2018-02-02 – 2018-02-06 (×5): 2 mg via ORAL
  Filled 2018-02-02 (×8): qty 1

## 2018-02-02 MED ORDER — VENLAFAXINE HCL ER 37.5 MG PO CP24
37.5000 mg | ORAL_CAPSULE | Freq: Every day | ORAL | Status: DC
  Administered 2018-02-02 – 2018-02-03 (×2): 37.5 mg via ORAL
  Filled 2018-02-02 (×5): qty 1

## 2018-02-02 NOTE — Plan of Care (Signed)
Patient was educated on several coping skills to deal with social anxiety and low self esteem, including writing 20 positive "I am" statements to keep on his bathroom mirror at home.

## 2018-02-02 NOTE — Progress Notes (Signed)
Pt is observed in room, seen laying in bed with eyes open. Pt was reluctant to answer some assessment questions with Clinical research associate. Pt repeatedly states "I don't know" while observed holding his head. Medication offered/scheduled and Pt refused. Snacks/fluids offered; fluids accepted. Pt denies SI/HI/AVH/Pain at this time. Pt states he wants to be left alone for now. Pt states he does not want to get into any trouble. Pt states he wants to go to bed early. Will continue with POC.

## 2018-02-02 NOTE — Progress Notes (Signed)
Pt woke up at this time requesting PRN meds for sleep. Trazodone and vistaril offered and accepted.

## 2018-02-02 NOTE — Progress Notes (Signed)
Adult Psychoeducational Group Note  Date:  02/02/2018 Time:  6:53 PM  Group Topic/Focus:  Coping With Mental Health Crisis:   The purpose of this group is to help patients identify strategies for coping with mental health crisis.  Group discusses possible causes of crisis and ways to manage them effectively.  Participation Level:  Active  Participation Quality:  Drowsy  Affect:  Anxious  Cognitive:  Appropriate  Insight: Improving  Engagement in Group:  Distracting  Modes of Intervention:  Discussion  Additional Comments:  Patient was very disoriented in group today, patient was crying and wanted to speak to the MD. Graceann Congress Celcia 02/02/2018, 6:53 PM

## 2018-02-02 NOTE — H&P (Signed)
Psychiatric Admission Assessment Adult  Patient Identification: Richard Lara MRN:  [id number] Date of Evaluation:  02/02/2018 Chief Complaint:  "  depressed, anxious" Principal Diagnosis:  MDD, no psychotic features  Diagnosis:   Patient Active Problem List   Diagnosis Date Noted  . MDD (major depressive disorder), single episode, severe , no psychosis ([place]) [F32.2] 02/01/2018   History of Present Illness: 42 year old married male . Presented to ED voluntarily , with [facility] counselor. Reports worsening depression and anxiety, particularly over the last two weeks. He reports this was partially related to work stressors : " a recent change in my  contract" which led to feeling overwhelmed.  He does report history of chronic mood disorder .  States he has had recent suicidal ideations, with thoughts of shooting self . States " I feel stuck". He endorses neuro-vegetative symptoms as below. Denies psychotic symptoms. In addition to depression, he reports  increased anxiety, mainly described as social anxiety type symptoms, states he feels very anxious when interacting with others, particularly if feeling judged, " like they are going to think something bad about me, or I am going say something that does not come out right". 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   Associated Signs/Symptoms: Depression Symptoms:  depressed mood, anhedonia, insomnia, suicidal thoughts with specific plan, anxiety, loss of energy/fatigue, decreased appetite, (Hypo) Manic Symptoms:  None noted or endorsed Anxiety Symptoms:  Reports increased anxiety, states he has increased anxiety around people, particularly when feeling he is at the center of attention or being judged. Does not endorse agoraphobia.  Psychotic Symptoms:  Denies  PTSD Symptoms: States he has been exposed to wounded people when in [employer], but currently does not endorse symptoms of PTSD. Total Time spent with  patient: 45 minutes  Past Psychiatric History: two prior psychiatric admissions for depression  , most recently 2016 while in the [employer] . Denies history of suicide attempts, denies history of self cutting, denies history of psychosis. States that in the past he has been diagnosed with Bipolar Disorder, but currently is not endorsing any clear history of mania or hypomania. Does endorse history of depression, anxiety, which he states " really started after my deployment overseas " ( 2016). Denies history of violence .   Is the patient at risk to self? Yes.    Has the patient been a risk to self in the past 6 months? Yes.    Has the patient been a risk to self within the distant past? No.  Is the patient a risk to others? No.  Has the patient been a risk to others in the past 6 months? No.  Has the patient been a risk to others within the distant past? No.   Prior Inpatient Therapy:  as above  Prior Outpatient Therapy:  no outpatient treatment at this time  Alcohol Screening: 1. How often do you have a drink containing alcohol?: 2 to 3 times a week 2. How many drinks containing alcohol do you have on a typical day when you are drinking?: 3 or 4 3. How often do you have six or more drinks on one occasion?: Monthly AUDIT-C Score: 6 4. How often during the last year have you found that you were not able to stop drinking once you had started?: Never 5. How often during the last year have you failed to do what was normally expected from you becasue of drinking?: Never 6. How often during the  last year have you needed a first drink in the morning to get yourself going after a heavy drinking session?: Never 7. How often during the last year have you had a feeling of guilt of remorse after drinking?: Monthly 8. How often during the last year have you been unable to remember what happened the night before because you had been drinking?: Never 9. Have you or someone else been injured as a result of  your drinking?: No 10. Has a relative or friend or a doctor or another health worker been concerned about your drinking or suggested you cut down?: No Alcohol Use Disorder Identification Test Final Score (AUDIT): 8 Intervention/Follow-up: Alcohol Education Substance Abuse History in the last 12 months:  Reports prior history of regular drinking, has cut down, but still drinks 2 drinks 3-4 times per week. Denies drug abuse. Consequences of Substance Abuse: Denies  Previous Psychotropic Medications: currently not on any psychiatric medications. States that in the past he has been on Zoloft, which was stopped due to sexual dysfunction, Wellbutrin XL in the past. He states he has not been on any psychiatric medications for more than 2 years . Psychological Evaluations:  No Past Medical History: States he has been diagnosed with Hypothyroidism in the past , but states he was taken off medication . Mildly elevated LFTs , had Abdomen US done 5/15 Past Medical History:  Diagnosis Date  . CAD (coronary artery disease)   . Depression   . PTSD (post-traumatic stress disorder)   . Thyroid disease     Past Surgical History:  Procedure Laterality Date  . ACHILLES TENDON SURGERY     Family History: parents alive, separated, no siblings  Family Psychiatric  History: denies psychiatric illness if family, no suicides in family, an uncle is alcoholic Tobacco Screening:  does not smoke or use tobacco products  Social History: married, has two children ages 35.6, lives at home with them, employed as a Automotive engineer, Animator)- honorable discharge Social History   Substance and Sexual Activity  Alcohol Use Yes     Social History   Substance and Sexual Activity  Drug Use No    Additional Social History: Marital status: Married Number of Years Married: 62 What types of issues is patient dealing with in the relationship?: no marital issues reported Are you sexually active?: Yes What is  your sexual orientation?: heterosexual Has your sexual activity been affected by drugs, alcohol, medication, or emotional stress?: decreased interest Does patient have children?: Yes How many children?: 2 How is patient's relationship with their children?: daugthers ages 43 and 17.  Great relationships.   Allergies:  No Known Allergies Lab Results:  Results for orders placed or performed during the hospital encounter of 02/01/18 (from the past 48 hour(s))  Rapid urine drug screen (hospital performed)     Status: None   Collection Time: 02/01/18 10:49 AM  Result Value Ref Range   Opiates NONE DETECTED NONE DETECTED   Cocaine NONE DETECTED NONE DETECTED   Benzodiazepines NONE DETECTED NONE DETECTED   Amphetamines NONE DETECTED NONE DETECTED   Tetrahydrocannabinol NONE DETECTED NONE DETECTED   Barbiturates NONE DETECTED NONE DETECTED    Comment: (NOTE) DRUG SCREEN FOR MEDICAL PURPOSES ONLY.  IF CONFIRMATION IS NEEDED FOR ANY PURPOSE, NOTIFY LAB WITHIN 5 DAYS. LOWEST DETECTABLE LIMITS FOR URINE DRUG SCREEN Drug Class                     Cutoff (ng/mL) Amphetamine  and metabolites    1000 Barbiturate and metabolites    200 Benzodiazepine                 633 Tricyclics and metabolites     300 Opiates and metabolites        300 Cocaine and metabolites        300 THC                            50 Performed at [facility], [place] [address], [place], [place] [address]   Urinalysis, Routine w reflex microscopic     Status: Abnormal   Collection Time: 02/01/18 10:49 AM  Result Value Ref Range   Color, Urine YELLOW YELLOW   APPearance TURBID (A) CLEAR   Specific Gravity, Urine 1.023 1.005 - 1.030   pH 5.0 5.0 - 8.0   Glucose, UA NEGATIVE NEGATIVE mg/dL   Hgb urine dipstick NEGATIVE NEGATIVE   Bilirubin Urine NEGATIVE NEGATIVE   Ketones, ur 5 (A) NEGATIVE mg/dL   Protein, ur NEGATIVE NEGATIVE mg/dL   Nitrite NEGATIVE NEGATIVE   Leukocytes, UA NEGATIVE NEGATIVE    Bacteria, UA RARE (A) NONE SEEN    Comment: Performed at [facility] [address] [place], [place] [address]  Comprehensive metabolic panel     Status: Abnormal   Collection Time: 02/01/18 10:50 AM  Result Value Ref Range   Sodium 141 135 - 145 mmol/L   Potassium 3.8 3.5 - 5.1 mmol/L   Chloride 108 101 - 111 mmol/L   CO2 20 (L) 22 - 32 mmol/L   Glucose, Bld 97 65 - 99 mg/dL   BUN 17 6 - 20 mg/dL   Creatinine, Ser 1.02 0.61 - 1.24 mg/dL   Calcium 9.5 8.9 - 10.3 mg/dL   Total Protein 8.4 (H) 6.5 - 8.1 g/dL   Albumin 4.6 3.5 - 5.0 g/dL   AST 42 (H) 15 - 41 U/L   ALT 87 (H) 17 - 63 U/L   Alkaline Phosphatase 77 38 - 126 U/L   Total Bilirubin 0.9 0.3 - 1.2 mg/dL   GFR calc non Af Amer >60 >60 mL/min   GFR calc Af Amer >60 >60 mL/min    Comment: (NOTE) The eGFR has been calculated using the CKD EPI equation. This calculation has not been validated in all clinical situations. eGFR's persistently <60 mL/min signify possible Chronic Kidney Disease.    Anion gap 13 5 - 15    Comment: Performed at [facility], [place] [address], [place], [place] [address]  Ethanol     Status: None   Collection Time: 02/01/18 10:50 AM  Result Value Ref Range   Alcohol, Ethyl (B) <10 <10 mg/dL    Comment: (NOTE) Lowest detectable limit for serum alcohol is 10 mg/dL. For medical purposes only. Performed at [facility], [place] [address], [place], [place] [address]   Salicylate level     Status: None   Collection Time: 02/01/18 10:50 AM  Result Value Ref Range   Salicylate Lvl <1.1 2.8 - 30.0 mg/dL    Comment: Performed at [facility], [place] [address] [place], [place] [address]  Acetaminophen level     Status: Abnormal   Collection Time: 02/01/18 10:50 AM  Result Value Ref Range   Acetaminophen (Tylenol), Serum <10 (L) 10 - 30 ug/mL    Comment: (NOTE) Therapeutic concentrations vary significantly. A range  of 10-30 ug/mL   may be an effective concentration for many patients. However, some  are best treated at concentrations outside of this range. Acetaminophen concentrations >150 ug/mL at 4 hours after ingestion  and >50 ug/mL at 12 hours after ingestion are often associated with  toxic reactions. Performed at [facility], [place] [address] [place], [place] [address]   cbc     Status: Abnormal   Collection Time: 02/01/18 10:50 AM  Result Value Ref Range   WBC 8.1 4.0 - 10.5 K/uL   RBC 5.49 4.22 - 5.81 MIL/uL   Hemoglobin 17.6 (H) 13.0 - 17.0 g/dL   HCT 48.5 39.0 - 52.0 %   MCV 88.3 78.0 - 100.0 fL   MCH 32.1 26.0 - 34.0 pg   MCHC 36.3 (H) 30.0 - 36.0 g/dL   RDW 13.1 11.5 - 15.5 %   Platelets 236 150 - 400 K/uL    Comment: Performed at [facility], [place] [address] [place], [place] [address]    Blood Alcohol level:  Lab Results  Component Value Date   ETH <10 09/81/1914    Metabolic Disorder Labs:  No results found for: HGBA1C, MPG No results found for: PROLACTIN No results found for: CHOL, TRIG, HDL, CHOLHDL, VLDL, LDLCALC  Current Medications: Current Facility-Administered Medications  Medication Dose Route Frequency Provider Last Rate Last Dose  . acetaminophen (TYLENOL) tablet 650 mg  650 mg Oral Q6H PRN Ethelene Hal, NP      . alum & mag hydroxide-simeth (MAALOX/MYLANTA) 200-200-20 MG/5ML suspension 30 mL  30 mL Oral Q4H PRN Ethelene Hal, NP      . hydrOXYzine (ATARAX/VISTARIL) tablet 50 mg  50 mg Oral TID Ethelene Hal, NP   50 mg at 02/02/18 0826  . ibuprofen (ADVIL,MOTRIN) tablet 600 mg  600 mg Oral Q8H PRN Ethelene Hal, NP      . loperamide (IMODIUM) capsule 2 mg  2 mg Oral Q6H PRN Ethelene Hal, NP      . magnesium hydroxide (MILK OF MAGNESIA) suspension 30 mL  30 mL Oral Daily PRN Ethelene Hal, NP      . ondansetron [facility]) tablet 4 mg  4 mg Oral Q8H PRN Ethelene Hal, NP      .  traZODone (DESYREL) tablet 50 mg  50 mg Oral QHS PRN Ethelene Hal, NP   50 mg at 02/02/18 0108   PTA Medications: Medications Prior to Admission  Medication Sig Dispense Refill Last Dose  . ibuprofen (ADVIL,MOTRIN) 200 MG tablet Take 800 mg by mouth every 6 (six) hours as needed for headache or mild pain.   01/31/2018 at Unknown time    Musculoskeletal: Strength & Muscle Tone: within normal limits Gait & Station: normal Patient leans: N/A  Psychiatric Specialty Exam: Physical Exam  Review of Systems  Constitutional: Negative.   HENT: Negative.   Eyes: Negative.   Respiratory: Negative.   Cardiovascular: Negative.   Gastrointestinal: Negative.   Genitourinary: Negative.   Musculoskeletal: Negative.   Skin: Negative.   Neurological: Negative for seizures and headaches.  Endo/Heme/Allergies: Negative.   Psychiatric/Behavioral: Positive for depression. The patient is nervous/anxious.   All other systems reviewed and are negative.   Blood pressure 133/90, pulse (!) 102, temperature 98.8 F (37.1 C), temperature source Oral, resp. rate 20, height 5' 11"  (1.803 m), weight 123.4 kg (272 lb), SpO2 100 %.Body mass index is 37.94 kg/m.  General Appearance: Fairly Groomed  Eye  Contact:  Fair  Speech:  Normal Rate  Volume:  Normal  Mood:  Depressed  Affect:  constricted, sad, vaguely anxious   Thought Process:  Linear and Descriptions of Associations: Intact  Orientation:  Other:  fully alert and attentive   Thought Content:  no hallucinations, no delusions, not internally preoccupied  Suicidal Thoughts:  No denies current suicidal or self injurious ideations, and contracts for safety  Homicidal Thoughts:  No  Memory:  recent and remote grossly intact   Judgement:  Fair  Insight:  Fair  Psychomotor Activity:  Normal  Concentration:  Concentration: Good and Attention Span: Good  Recall:  Good  Fund of Knowledge:  Good  Language:  Good  Akathisia:  Negative  Handed:   Right  AIMS (if indicated):     Assets:  Communication Skills Desire for Improvement Resilience  ADL's:  Intact  Cognition:  WNL  Sleep:  Number of Hours: 6.25    Treatment Plan Summary: Daily contact with patient to assess and evaluate symptoms and progress in treatment, Medication management, Plan inpatient treatment and medications as below  Observation Level/Precautions:  15 minute checks  Laboratory: as needed   Psychotherapy: milieu, group therapy    Medications:  Patient presents with severe anxiety, warrants short term BZD management to address.  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    Jenne Campus, MD 5/16/201910:57 AM

## 2018-02-02 NOTE — [facility] Group Notes (Signed)
[facility] LCSW Group Therapy Note  Date/Time: 02/02/18, 1315  Type of Therapy/Topic:  Group Therapy:  Balance in Life  Participation Level:  minimal  Description of Group:    This group will address the concept of balance and how it feels and looks when one is unbalanced. Patients will be encouraged to process areas in their lives that are out of balance, and identify reasons for remaining unbalanced. Facilitators will guide patients utilizing problem- solving interventions to address and correct the stressor making their life unbalanced. Understanding and applying boundaries will be explored and addressed for obtaining  and maintaining a balanced life. Patients will be encouraged to explore ways to assertively make their unbalanced needs known to significant others in their lives, using other group members and facilitator for support and feedback.  Therapeutic Goals: 1. Patient will identify two or more emotions or situations they have that consume much of in their lives. 2. Patient will identify signs/triggers that life has become out of balance:  3. Patient will identify two ways to set boundaries in order to achieve balance in their lives:  4. Patient will demonstrate ability to communicate their needs through discussion and/or role plays  Summary of Patient Progress:Pt shared that family is out of balance in his life.  Pt shared pretty extensively with the group his desire to have some time away from his young children with his wife and the wife's resistance to ever leaving the kids.  Good participation.          Therapeutic Modalities:   Cognitive Behavioral Therapy Solution-Focused Therapy Assertiveness Training  Daleen [place], [place]

## 2018-02-02 NOTE — [facility] Suicide Risk Assessment (Signed)
[facility] Admission Suicide Risk Assessment   Nursing information obtained from:   patient and chart  Demographic factors:   42 year old married male, lives with wife and children, employed  Current Mental Status:   see below Loss Factors:   work related stressors Historical Factors:   depression, anxiety, prior psychiatric admissions for depression Risk Reduction Factors:   resilience   Total Time spent with patient: 45 minutes Principal Problem:  MDD, Severe, No Psychotic Features  Diagnosis:   Patient Active Problem List   Diagnosis Date Noted  . MDD (major depressive disorder), single episode, severe , no psychosis ([facility]) [F32.2] 02/01/2018    Continued Clinical Symptoms:  Alcohol Use Disorder Identification Test Final Score (AUDIT): 8 The "Alcohol Use Disorders Identification Test", Guidelines for Use in Primary Care, Second Edition.  World Science writer [facility]). Score between 0-7:  no or low risk or alcohol related problems. Score between 8-15:  moderate risk of alcohol related problems. Score between 16-19:  high risk of alcohol related problems. Score 20 or above:  warrants further diagnostic evaluation for alcohol dependence and treatment.   CLINICAL FACTORS:  42 year old male, presents to ED voluntarily due to worsening depression, suicidal ideations, increased anxiety, social phobia symptoms.    Psychiatric Specialty Exam: Physical Exam  ROS  Blood pressure 133/90, pulse (!) 102, temperature 98.8 F (37.1 C), temperature source Oral, resp. rate 20, height  (1.803 m), weight 123.4 kg (272 lb), SpO2 100 %.Body mass index is 37.94 kg/m.  See admit note MSE   COGNITIVE FEATURES THAT CONTRIBUTE TO RISK:  Closed-mindedness and Loss of executive function    SUICIDE RISK:   Moderate:  Frequent suicidal ideation with limited intensity, and duration, some specificity in terms of plans, no associated intent, good self-control, limited dysphoria/symptomatology, some  risk factors present, and identifiable protective factors, including available and accessible social support.  PLAN OF CARE: Patient will be admitted to inpatient psychiatric unit for stabilization and safety. Will provide and encourage milieu participation. Provide medication management and maked adjustments as needed.  Will follow daily.    I certify that inpatient services furnished can reasonably be expected to improve the patient's condition.   Craige Cotta, MD 02/02/2018, 11:32 AM
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
## 2018-02-02 NOTE — Progress Notes (Signed)
D: Patient presents tearful, depressed, anxious, guarded. Patient works as a professor of physical therapy and has been having difficulty performing his daily tasks. He struggles with social anxiety, due to feeling like he "always puts my foot in my mouth." He reports when he sits in meetings at work, he blurts out things that he regrets. He describes poor impulse control. Patient was visibly tearful, holding head in his hands during group. Actively having suicidal thoughts, but denies having a plan or intent. Patient reports depression and hopelessness 8/10. Anxiety 8/10. Concentration poor, energy low. Patient slept "fair" last night and received trazodone last night. A: Patient checked q15 min, and checks reviewed. Reviewed medication changes with patient and educated on side effects. Educated patient on importance of attending group therapy sessions and educated on several coping skills. Encouarged participation in milieu through recreation therapy and attending meals with peers. RN pulled patient out of group and offered support and encouragement. Educated on several coping skills for social anxiety.  R: Patient receptive to education on medications, and is medication compliant. Patient attending all group therapy and cafeteria with peers. Patient contracts for safety on the unit. Goal for today "see Dr., get a plan" and to meet this "see a doctor."

## 2018-02-02 NOTE — Progress Notes (Addendum)
Pt is observed in dayroom, eating a snack. Pt denies SI/HI/AVH/Pain at this time. Pt states he felt better today.Pt attended wrap-up group this evening and participated. Pt went to bed early without needing PRNs.Will continue with POC.

## 2018-02-03 DIAGNOSIS — G4700 Insomnia, unspecified: Secondary | ICD-10-CM

## 2018-02-03 LAB — LIPID PANEL
Cholesterol: 176 mg/dL (ref 0–200)
HDL: 45 mg/dL (ref >40)
LDL Cholesterol: 117 mg/dL — ABNORMAL HIGH (ref 0–99)
Total CHOL/HDL Ratio: 3.9 RATIO
Triglycerides: 71 mg/dL (ref <150)
VLDL: 14 mg/dL (ref 0–40)

## 2018-02-03 LAB — TSH: TSH: 5.849 u[IU]/mL — ABNORMAL HIGH (ref 0.350–4.500)

## 2018-02-03 LAB — T4, FREE: FREE T4: 1.14 ng/dL (ref 0.82–1.77)

## 2018-02-03 LAB — HEMOGLOBIN A1C
Hgb A1c MFr Bld: 4.8 % (ref 4.8–5.6)
Mean Plasma Glucose: 91.06 mg/dL

## 2018-02-03 MED ORDER — VENLAFAXINE HCL ER 75 MG PO CP24
75.0000 mg | ORAL_CAPSULE | Freq: Every day | ORAL | Status: DC
  Administered 2018-02-04 – 2018-02-06 (×3): 75 mg via ORAL
  Filled 2018-02-03 (×5): qty 1

## 2018-02-03 NOTE — Progress Notes (Signed)
[facility] MD Progress Note  02/03/2018 1:37 PM Richard Lara  MRN:  [id number] Subjective: Patient states he feels a little better today, still feeling depressed and anxious. Currently denies suicidal ideations.  Does not currently endorse medication side effects. Objective: I have discussed case with treatment team and have met with patient. 42 year old married male, veteran, receives outpatient services at [facility], presented for worsening depression and anxiety. Patient reports partial improvement, but remains depressed, anxious, ruminating , denies suicidal ideations.  He has been visible on unit and going to some groups.  Cooperative on approach. Thus far tolerating medications well. Labs reviewed- TSH 5.84 , HgbA1c 4.8  Patient states that he had been diagnosed with hypothyroidism in the past but that Synthroid have been stopped by his PCP, because it was felt he was doing well. 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 MDD (major depressive disorder), single episode, severe , no psychosis ([place]) [F32.2] 02/01/2018   Total Time spent with patient: 20 minutes  Past Psychiatric History:   Past Medical History:  Past Medical History:  Diagnosis Date  . CAD (coronary artery disease)   . Depression   . PTSD (post-traumatic stress disorder)   . Thyroid disease     Past Surgical History:  Procedure Laterality Date  . ACHILLES TENDON SURGERY     Family History: History reviewed. No pertinent family history. Family Psychiatric  History:  Social History:  Social History   Substance and Sexual Activity  Alcohol Use Yes     Social History   Substance and Sexual Activity  Drug Use No    Social History   Socioeconomic History  . Marital status: Married    Spouse name: Not on file  . Number of children: Not on file  . Years of education: Not on file  . Highest education level: Not on file  Occupational  History  . Not on file  Social Needs  . Financial resource strain: Not on file  . Food insecurity:    Worry: Not on file    Inability: Not on file  . Transportation needs:    Medical: Not on file    Non-medical: Not on file  Tobacco Use  . Smoking status: Never Smoker  . Smokeless tobacco: Never Used  Substance and Sexual Activity  . Alcohol use: Yes  . Drug use: No  . Sexual activity: Not on file  Lifestyle  . Physical activity:    Days per week: Not on file    Minutes per session: Not on file  . Stress: Not on file  Relationships  . Social connections:    Talks on phone: Not on file    Gets together: Not on file    Attends religious service: Not on file    Active member of club or organization: Not on file    Attends meetings of clubs or organizations: Not on file    Relationship status: Not on file  Other Topics Concern  . Not on file  Social History Narrative  . Not on file   Additional Social History:   Sleep: Fair  Appetite:  Fair  Current Medications: Current Facility-Administered Medications  Medication Dose Route Frequency Provider Last Rate Last Dose  . acetaminophen (TYLENOL) tablet 650 mg  650 mg Oral Q6H PRN Ethelene Hal, NP      . alum & mag hydroxide-simeth (MAALOX/MYLANTA) 200-200-20 MG/5ML suspension 30 mL  30 mL Oral Q4H PRN Ethelene Hal, NP      . ARIPiprazole (ABILIFY) tablet 2 mg  2 mg Oral Daily Vontae Court, Myer Peer, MD   2 mg at 02/03/18 0900  . ibuprofen (ADVIL,MOTRIN) tablet 600 mg  600 mg Oral Q8H PRN Ethelene Hal, NP      . loperamide (IMODIUM) capsule 2 mg  2 mg Oral Q6H PRN Ethelene Hal, NP      . LORazepam (ATIVAN) tablet 1 mg  1 mg Oral Q8H PRN Kashayla Ungerer A, MD      . magnesium hydroxide (MILK OF MAGNESIA) suspension 30 mL  30 mL Oral Daily PRN Ethelene Hal, NP      . ondansetron [facility]) tablet 4 mg  4 mg Oral Q8H PRN Ethelene Hal, NP      . traZODone (DESYREL) tablet 50 mg  50  mg Oral QHS PRN Ethelene Hal, NP   50 mg at 02/02/18 0108  . venlafaxine XR (EFFEXOR-XR) 24 hr capsule 37.5 mg  37.5 mg Oral Q breakfast Lun Muro, Myer Peer, MD   37.5 mg at 02/03/18 3785    Lab Results:  Results for orders placed or performed during the hospital encounter of 02/01/18 (from the past 48 hour(s))  TSH     Status: Abnormal   Collection Time: 02/03/18  6:24 AM  Result Value Ref Range   TSH 5.849 (H) 0.350 - 4.500 uIU/mL    Comment: Performed by a 3rd Generation assay with a functional sensitivity of <=0.01 uIU/mL. Performed at [facility], [place] [address], [place], [place] [address]   Hemoglobin A1c     Status: None   Collection Time: 02/03/18  6:24 AM  Result Value Ref Range   Hgb A1c MFr Bld 4.8 4.8 - 5.6 %    Comment: (NOTE) Pre diabetes:          5.7%-6.4% Diabetes:              >6.4% Glycemic control for   <7.0% adults with diabetes    Mean Plasma Glucose 91.06 mg/dL    Comment: Performed at [facility] [address], [place], [place] [address]  Lipid panel     Status: Abnormal   Collection Time: 02/03/18  6:24 AM  Result Value Ref Range   Cholesterol 176 0 - 200 mg/dL   Triglycerides 71 <150 mg/dL   HDL 45 >40 mg/dL   Total CHOL/HDL Ratio 3.9 RATIO   VLDL 14 0 - 40 mg/dL   LDL Cholesterol 117 (H) 0 - 99 mg/dL    Comment:        Total Cholesterol/HDL:CHD Risk Coronary Heart Disease Risk Table                     Men   Women  1/2 Average Risk   3.4   3.3  Average Risk       5.0   4.4  2 X Average Risk   9.6   7.1  3 X Average Risk  23.4   11.0        Use the calculated Patient Ratio above and the CHD Risk Table to determine the patient's CHD Risk.        ATP III CLASSIFICATION (LDL):  <100     mg/dL   Optimal  100-129  mg/dL   Near or Above  Optimal  130-159  mg/dL   Borderline  160-189  mg/dL   High  >190     mg/dL   Very High Performed at [place] [facility] [address],  [place], [place] [address]     Blood Alcohol level:  Lab Results  Component Value Date   ETH <10 02/72/5366    Metabolic Disorder Labs: Lab Results  Component Value Date   HGBA1C 4.8 02/03/2018   MPG 91.06 02/03/2018   No results found for: PROLACTIN Lab Results  Component Value Date   CHOL 176 02/03/2018   TRIG 71 02/03/2018   HDL 45 02/03/2018   CHOLHDL 3.9 02/03/2018   VLDL 14 02/03/2018   LDLCALC 117 (H) 02/03/2018    Physical Findings: AIMS: Facial and Oral Movements Muscles of Facial Expression: None, normal Lips and Perioral Area: None, normal Jaw: None, normal Tongue: None, normal,Extremity Movements Upper (arms, wrists, hands, fingers): None, normal Lower (legs, knees, ankles, toes): None, normal, Trunk Movements Neck, shoulders, hips: None, normal, Overall Severity Severity of abnormal movements (highest score from questions above): None, normal Incapacitation due to abnormal movements: None, normal Patient's awareness of abnormal movements (rate only patient's report): No Awareness, Dental Status Current problems with teeth and/or dentures?: No Does patient usually wear dentures?: No  CIWA:  CIWA-Ar Total: 3 COWS:     Musculoskeletal: Strength & Muscle Tone: within normal limits Gait & Station: normal Patient leans: N/A  Psychiatric Specialty Exam: Physical Exam  ROS  denies headache, no chest pain, no shortness of breath, no vomiting  Blood pressure 131/89, pulse 99, temperature 98.6 F (37 C), temperature source Oral, resp. rate 20, height 5' 11"  (1.803 m), weight 123.4 kg (272 lb), SpO2 100 %.Body mass index is 37.94 kg/m.  General Appearance: Well Groomed  Eye Contact:  Fair  Speech:  Normal Rate  Volume:  Normal  Mood:  depressed, anxious, but states he is feeling somewhat better today  Affect:  remains constricted, less severely anxious   Thought Process:  Linear and Descriptions of Associations: Intact  Orientation:  Full (Time, Place, and  Person)  Thought Content:  no hallucinations , no delusions, not internally preoccupied   Suicidal Thoughts:  No denies suicidal or self injurious ideations, no homicidal or violent ideations  Homicidal Thoughts:  No  Memory:  recent and remote grossly intact   Judgement:  Fair  Insight:  Fair  Psychomotor Activity:  Normal  Concentration:  Concentration: Good and Attention Span: Good  Recall:  good   Fund of Knowledge:  Good  Language:  Good  Akathisia:  Negative  Handed:  Right  AIMS (if indicated):     Assets:  Communication Skills Resilience  ADL's:  Intact  Cognition:  WNL  Sleep:  Number of Hours: 6.75   Assessment - patient remains depressed, anxious, but endorses slight improvement compared to how he felt prior to admission. Does present with less severe anxiety today. Denies suicidal ideations. Currently tolerating Effexor XR, Abilify combination well. TSH mildly elevated- does have a history of prior hypothyroidism diagnosis.   Treatment Plan Summary: Daily contact with patient to assess and evaluate symptoms and progress in treatment, Medication management, Plan inpatient admission and medications as below Encourage group and milieu participation to work on coping skills and symptom reduction Increase Effexor XR to 75 mgrs QDAY for depression and anxiety Continue Abilify 2 mgrs QDAY for depression, antidepressant augmentation strategy Continue Ativan 1 mgr Q 6 hours PRN for anxiety as needed  Continue Trazodone 50 mgr QHS PRN for insomnia as needed Will order T3, T4 to follow up on elevated TSH. Jenne Campus, MD 02/03/2018, 1:37 PM

## 2018-02-03 NOTE — BHH Group Notes (Signed)
LCSW Group Therapy Note 02/03/2018 2:22 PM  Type of Therapy and Topic: Group Therapy: Avoiding Self-Sabotaging and Enabling Behaviors  Participation Level: Active  Description of Group:  In this group, patients will learn how to identify obstacles, self-sabotaging and enabling behaviors, as well as: what are they, why do we do them and what needs these behaviors meet. Discuss unhealthy relationships and how to have positive healthy boundaries with those that sabotage and enable. Explore aspects of self-sabotage and enabling in yourself and how to limit these self-destructive behaviors in everyday life.  Therapeutic Goals: 1. Patient will identify one obstacle that relates to self-sabotage and enabling behaviors 2. Patient will identify one personal self-sabotaging or enabling behavior they did prior to admission 3. Patient will state a plan to change the above identified behavior 4. Patient will demonstrate ability to communicate their needs through discussion and/or role play.   Summary of Patient Progress:  Richard Lara was engaged and participated throughout the group session. Richard Lara reports that his self sabotaging behavior is "being too judgmental". Richard Lara reports that is he is not judgmental of others, then he would not think that others were judging him, which contributes to his paranoia.     Therapeutic Modalities:  Cognitive Behavioral Therapy Person-Centered Therapy Motivational Interviewing   Baldo Daub LCSWA Clinical Social Worker

## 2018-02-03 NOTE — Plan of Care (Signed)
  Problem: Safety: Goal: Ability to disclose and discuss suicidal ideas will improve Outcome: Progressing   Problem: Coping: Goal: Ability to identify and develop effective coping behavior will improve Outcome: Progressing   Problem: Activity: Goal: Interest or engagement in activities will improve Outcome: Progressing D: Pt visible in dayroom on initial contact. A & O X4. Denies SI, HI, AVH and pain; pt was observed actively talking to himself at medication window. Presents guarded / withdrawned with depressed affect and is very anxious / restless and poor eye contact on interactions. Reports he slept fairly well last night with good appetite. Rates his depression 6/10, anxiety 8/10 and hopelessness all 6/10. Pt's goal this shift is to "get my anxiety down". Verbalized difficulties related to "reintegrating into society after being in the [employer] for 20 years; I don't know how to do it, I need help, I need someone to help me with all this, I'm 42 year old and feels like a dinosaurs, lot of things have changed".  A: Introduced self to pt. Emotional support and availability provided to pt. All medications administered with verbal education and effects monitored. Encouraged pt to notified social worker in AM about resources for [employer] and he's in agreement. Safety checks maintained at Q 15 minutes intervals without outburst or self harm gestures thus far.   R: Pt receptive to care. Compliant with medications when offered. Denies adverse drug reactions when assessed this shift. Tolerates all PO intake well. Noted in scheduled unit groups. Off unit for meals, returned without issues. Remains safe on and off unit.

## 2018-02-03 NOTE — Progress Notes (Signed)
Writer has observed patient up in the dayroom watching tv after attending group. Writer spoke with him 1:1 and he reports having had a good day. He was informed of medications available but he declined to take anything. He reports that he is hopeful to to learn about resources for him. 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 He reports that he is going to try and rest on his own tonight without the aid of medication. He denies si/hi/a/v hallucinations. Support given and safety maintained on unit with 15 min checks.

## 2018-02-03 NOTE — Progress Notes (Signed)
Adult Psychoeducational Group Note  Date:  02/03/2018 Time:  9:10 PM  Group Topic/Focus:  Building Self Esteem:   The Focus of this group is helping patients become aware of the effects of self-esteem on their lives, the things they and others do that enhance or undermine their self-esteem, seeing the relationship between their level of self-esteem and the choices they make and learning ways to enhance self-esteem. Wrap-Up Group:   The focus of this group is to help patients review their daily goal of treatment and discuss progress on daily workbooks.  Participation Level:  Active  Participation Quality:  Appropriate and Sharing  Affect:  Appropriate  Cognitive:  Alert and Oriented  Insight: Appropriate and Improving  Engagement in Group:  Developing/Improving and Engaged  Modes of Intervention:  Clarification, Exploration and Support  Additional Comments:  Pt verbalized that he would like to work on a plan for when he gets out of here. Pt verbalized that he wants to be good and healthy. Pt verbalized that balance is an issue.  Jeramy Dimmick, Randal Buba 02/03/2018, 9:10 PM

## 2018-02-03 NOTE — Tx Team (Signed)
Interdisciplinary Treatment and Diagnostic Plan Update  02/03/2018 Time of Session: 10:00am  Richard Lara MRN: [id number]  Principal Diagnosis: <principal problem not specified>  Secondary Diagnoses: Active Problems:   MDD (major depressive disorder), single episode, severe , no psychosis ([place])   Current Medications:  Current Facility-Administered Medications  Medication Dose Route Frequency Provider Last Rate Last Dose  . acetaminophen (TYLENOL) tablet 650 mg  650 mg Oral Q6H PRN Ethelene Hal, NP      . alum & mag hydroxide-simeth (MAALOX/MYLANTA) 200-200-20 MG/5ML suspension 30 mL  30 mL Oral Q4H PRN Ethelene Hal, NP      . ARIPiprazole (ABILIFY) tablet 2 mg  2 mg Oral Daily Cobos, Myer Peer, MD   2 mg at 02/03/18 0900  . ibuprofen (ADVIL,MOTRIN) tablet 600 mg  600 mg Oral Q8H PRN Ethelene Hal, NP      . loperamide (IMODIUM) capsule 2 mg  2 mg Oral Q6H PRN Ethelene Hal, NP      . LORazepam (ATIVAN) tablet 1 mg  1 mg Oral Q8H PRN Cobos, Fernando A, MD      . magnesium hydroxide (MILK OF MAGNESIA) suspension 30 mL  30 mL Oral Daily PRN Ethelene Hal, NP      . ondansetron [facility]) tablet 4 mg  4 mg Oral Q8H PRN Ethelene Hal, NP      . traZODone (DESYREL) tablet 50 mg  50 mg Oral QHS PRN Ethelene Hal, NP   50 mg at 02/02/18 0108  . venlafaxine XR (EFFEXOR-XR) 24 hr capsule 37.5 mg  37.5 mg Oral Q breakfast Cobos, Myer Peer, MD   37.5 mg at 02/03/18 1191   PTA Medications: Medications Prior to Admission  Medication Sig Dispense Refill Last Dose  . ibuprofen (ADVIL,MOTRIN) 200 MG tablet Take 800 mg by mouth every 6 (six) hours as needed for headache or mild pain.   01/31/2018 at Unknown time    Patient Stressors: Medication change or noncompliance Traumatic event  Patient Strengths: General fund of knowledge Supportive family/friends  Treatment Modalities: Medication Management, Group therapy, Case management,  1 to 1  session with clinician, Psychoeducation, Recreational therapy.   Physician Treatment Plan for Primary Diagnosis: <principal problem not specified> Long Term Goal(s): Improvement in symptoms so as ready for discharge Improvement in symptoms so as ready for discharge   Short Term Goals: Ability to identify changes in lifestyle to reduce recurrence of condition will improve Ability to maintain clinical measurements within normal limits will improve Ability to identify changes in lifestyle to reduce recurrence of condition will improve Ability to maintain clinical measurements within normal limits will improve  Medication Management: Evaluate patient's response, side effects, and tolerance of medication regimen.  Therapeutic Interventions: 1 to 1 sessions, Unit Group sessions and Medication administration.  Evaluation of Outcomes: Not Met  Physician Treatment Plan for Secondary Diagnosis: Active Problems:   MDD (major depressive disorder), single episode, severe , no psychosis ([place])  Long Term Goal(s): Improvement in symptoms so as ready for discharge Improvement in symptoms so as ready for discharge   Short Term Goals: Ability to identify changes in lifestyle to reduce recurrence of condition will improve Ability to maintain clinical measurements within normal limits will improve Ability to identify changes in lifestyle to reduce recurrence of condition will improve Ability to maintain clinical measurements within normal limits will improve     Medication Management: Evaluate patient's response, side effects, and tolerance of medication regimen.  Therapeutic Interventions: 1  to 1 sessions, Unit Group sessions and Medication administration.  Evaluation of Outcomes: Not Met   RN Treatment Plan for Primary Diagnosis: <principal problem not specified> Long Term Goal(s): Knowledge of disease and therapeutic regimen to maintain health will improve  Short Term Goals: Ability to verbalize  frustration and anger appropriately will improve, Ability to demonstrate self-control, Ability to disclose and discuss suicidal ideas and Compliance with prescribed medications will improve  Medication Management: RN will administer medications as ordered by provider, will assess and evaluate patient's response and provide education to patient for prescribed medication. RN will report any adverse and/or side effects to prescribing provider.  Therapeutic Interventions: 1 on 1 counseling sessions, Psychoeducation, Medication administration, Evaluate responses to treatment, Monitor vital signs and CBGs as ordered, Perform/monitor CIWA, COWS, AIMS and Fall Risk screenings as ordered, Perform wound care treatments as ordered.  Evaluation of Outcomes: Not Met   LCSW Treatment Plan for Primary Diagnosis: <principal problem not specified> Long Term Goal(s): Safe transition to appropriate next level of care at discharge, Engage patient in therapeutic group addressing interpersonal concerns.  Short Term Goals: Engage patient in aftercare planning with referrals and resources, Increase social support, Increase ability to appropriately verbalize feelings, Increase emotional regulation, Facilitate acceptance of mental health diagnosis and concerns, Facilitate patient progression through stages of change regarding substance use diagnoses and concerns, Identify triggers associated with mental health/substance abuse issues and Increase skills for wellness and recovery  Therapeutic Interventions: Assess for all discharge needs, 1 to 1 time with Social worker, Explore available resources and support systems, Assess for adequacy in community support network, Educate family and significant other(s) on suicide prevention, Complete Psychosocial Assessment, Interpersonal group therapy.  Evaluation of Outcomes: Not Met   Progress in Treatment: Attending groups: Yes. Participating in groups: Yes. Taking medication as  prescribed: Yes. Toleration medication: Yes. Family/Significant other contact made: No, will contact:  CSW will assess for an appropriate collateral contact. if given consent Patient understands diagnosis: Yes. Discussing patient identified problems/goals with staff: Yes. Medical problems stabilized or resolved: Yes. Denies suicidal/homicidal ideation: Yes. Issues/concerns per patient self-inventory: No. Other:  New problem(s) identified: None   New Short Term/Long Term Goal(s): medication stabilization, elimination of SI thoughts, development of comprehensive mental wellness plan.    Patient Goals: "I was in a bad place and needed help. I need to be in a good place to be in a better mentality"   Discharge Plan or Barriers: CSW will continue to assess for an appropriate discharge plan.   Reason for Continuation of Hospitalization: Anxiety Depression Medication stabilization  Estimated Length of Stay: Wednesday, 02/08/18  Attendees: Patient: Latrail Pounders 02/03/2018 1:13 PM  Physician: Dr. Neita Garnet, MD 02/03/2018 1:13 PM  Nursing: Nicoletta Dress.Viona Gilmore, RN 02/03/2018 1:13 PM  RN Care Manager: Rhunette Croft 02/03/2018 1:13 PM  Social Worker: Radonna Ricker, [place] 02/03/2018 1:13 PM  Recreational Therapist: X 02/03/2018 1:13 PM  Other: X 02/03/2018 1:13 PM  Other: X 02/03/2018 1:13 PM  Other:X 02/03/2018 1:13 PM    Scribe for Treatment Team: Marylee Floras, [place] 02/03/2018 1:13 PM

## 2018-02-03 NOTE — Progress Notes (Signed)
Recreation Therapy Notes  Date: 5.17.19 Time: 0930 Location: [address] Dayroom  Group Topic: Stress Management  Goal Area(s) Addresses:  Patient will verbalize importance of using healthy stress management.  Patient will identify positive emotions associated with healthy stress management.   Behavioral Response: Engaged  Intervention: Stress Management  Activity :  Progressive Muscle Relaxation.  LRT lead patients through the process of tensing each muscle group then releasing the tension.  Patients were to follow along as LRT read script to guide patients through the process.  Education:  Stress Management, Discharge Planning.   Education Outcome: Acknowledges edcuation/In group clarification offered/Needs additional education  Clinical Observations/Feedback: Pt attended and participated in group.    Caroll Rancher, LRT/CTRS         Lillia Abed, Deaglan Lile A 02/03/2018 12:12 PM

## 2018-02-04 DIAGNOSIS — Z79899 Other long term (current) drug therapy: Secondary | ICD-10-CM

## 2018-02-04 DIAGNOSIS — E039 Hypothyroidism, unspecified: Secondary | ICD-10-CM

## 2018-02-04 DIAGNOSIS — F4310 Post-traumatic stress disorder, unspecified: Secondary | ICD-10-CM

## 2018-02-04 LAB — T3, FREE: T3 FREE: 3.1 pg/mL (ref 2.0–4.4)

## 2018-02-04 NOTE — Progress Notes (Signed)
Writer has observed patient up in the dayroom watching  tv with no interaction with peers. Writer spoke with him 1:1 and he reports his goal is to find resources for transitioning from [employer] life to civilian life. 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 He reports this causes him to feel anxious also because he doesn't want to start off feeling behind before he goes back to work. Support given and safety maintained on unit with 15 min checks.

## 2018-02-04 NOTE — Plan of Care (Signed)
Patient demonstrates mild improvement since admission. Still appears depressed, flat. Patient is medication compliant, however. Will continue to monitor closely.

## 2018-02-04 NOTE — BHH Group Notes (Signed)
LCSW Group Therapy Note  02/04/2018   10:00 - 11:00 AM               Type of Therapy and Topic:  Group Therapy: Anger Cues and Responses  Participation Level:  Active  In this group, patients learned how to recognize the physical, cognitive, emotional, and behavioral responses they have to anger-provoking situations.  They identified a recent time they became angry and how they reacted.  They analyzed how their reaction was possibly beneficial and how it was possibly unhelpful.  The group discussed anger warning signs and how to know when our anger can potentially become a problem. The group will discuss how our thoughts impact our feelings which in result affect our behaviors. Patients will learn thought replacement and explore alternative emotions in addition to feeling anger.   Therapeutic Goals: 1. Patients will remember their last incident of anger and how they felt emotionally and physically, what their thoughts were at the time, and how they behaved. 2. Patients will identify how their behavior at that time worked for them, as well as how it worked against them. 3. Patients will explore how their body, mind and feelings play a role with anger. 4. Patients will learn that anger itself is normal and cannot be eliminated, and that healthier reactions can assist with resolving conflict rather than worsening situations. 5. Patients will learn thought replacement and discuss how to implement using scenarios provided by CSW  Summary of Patient Progress:  Patient was engaged and participated throughout the group session when called on by CSW. The patient shared that his most recent time of anger was when the organization he is a part of stated they believe no provider has the right to refuse care based on their religion. Patient stated he does not agree that he should be made to preform an abortion, now he is being forced to choose between his religion and profession. Patient reports warning signs he  is angry to be shortness of breath and verbal aggression. Patient reports thoughts he had to be "what do  I do know" and "why cant it be both ways". Patient reports another feeling in addition to anger to be frustration. Patient reports he does not know his coping mechanisms that he just keeps himself busy and lets it out, although sometimes not in a good way.   Therapeutic Modalities:   Cognitive Behavioral Therapy  Shellia Cleverly, LCSW  02/04/2018 1:59 PM

## 2018-02-04 NOTE — Progress Notes (Signed)
D: Patient presents flat, depressed, guarded. His sleep was fair, and he did not receive medication to help. He reports a fair appetite, normal energy level, and good concentration. Depression is rated 2/10, hopelessness 4/10, and anxiety 7/10. Patient denies SI/HI/AVH.  A: Patient checked q15 min, and checks reviewed. Reviewed medication changes with patient and educated on side effects. Educated patient on importance of attending group therapy sessions and educated on several coping skills. Encouarged participation in milieu through recreation therapy and attending meals with peers.  R: Patient receptive to education on medications, and is medication compliant. Patient attending all group therapy, recreation therapy sessions, and cafeteria with peers. Patient contracts for safety on the unit. Goal: "Work with Child psychotherapist on exit plan" and to meet that goal: "Meet with Child psychotherapist?"

## 2018-02-04 NOTE — BHH Group Notes (Signed)
   Date:  02/04/2018  Time:  3:57 PM  Type of Therapy:  Nurse Education /  The group focuses on teaching patients how to identify their needs and then how ot develop the skills needed to get them met.   Participation Level:  Active  Participation Quality:  Attentive  Affect:  Appropriate  Cognitive:  Appropriate  Insight:  Good  Engagement in Group:  Improving  Modes of Intervention:  Discussion  Summary of Progress/Problems:  Richard Lara 02/04/2018, 3:57 PM

## 2018-02-04 NOTE — Progress Notes (Signed)
[facility] MD Progress Note  02/04/2018 11:29 AM Richard Lara  MRN:  [id number] Subjective: Patient is seen and examined.  Patient is a 42 year old male with a reported history of posttraumatic stress disorder who was admitted on 02/01/2018 with suicidal ideation.  The patient was brought with a counselor from the [place] [facility] who reported the patient had called the suicide hotline expressing suicidal ideation with intent to shoot himself with a gun.  He had reported worsening depression, insomnia and psychomotor agitation for the last 2 weeks.  He had 2 previous inpatient stays in [place].  He was diagnosed with bipolar disorder and started on Zoloft.  This was later switched to Wellbutrin.  He was admitted to our facility.  He continues on Effexor and Abilify at this point.  He denied any suicidal ideation today, and wants to be able to get home soon.  He stated his wife is taking care of their 2 children without him and she needs to go to work.  He denied any side effects to the current medications. Principal Problem: <principal problem not specified> Diagnosis:   Patient Active Problem List   Diagnosis Date Noted  . MDD (major depressive disorder), single episode, severe , no psychosis ([facility]) [F32.2] 02/01/2018   Total Time spent with patient: 20 minutes  Past Psychiatric History: See admission H&P Past Medical History:  Past Medical History:  Diagnosis Date  . CAD (coronary artery disease)   . Depression   . PTSD (post-traumatic stress disorder)   . Thyroid disease     Past Surgical History:  Procedure Laterality Date  . ACHILLES TENDON SURGERY     Family History: History reviewed. No pertinent family history. Family Psychiatric  History: See admission H&P Social History:  Social History   Substance and Sexual Activity  Alcohol Use Yes     Social History   Substance and Sexual Activity  Drug Use No    Social History   Socioeconomic History  . Marital status: Married     Spouse name: Not on file  . Number of children: Not on file  . Years of education: Not on file  . Highest education level: Not on file  Occupational History  . Not on file  Social Needs  . Financial resource strain: Not on file  . Food insecurity:    Worry: Not on file    Inability: Not on file  . Transportation needs:    Medical: Not on file    Non-medical: Not on file  Tobacco Use  . Smoking status: Never Smoker  . Smokeless tobacco: Never Used  Substance and Sexual Activity  . Alcohol use: Yes  . Drug use: No  . Sexual activity: Not on file  Lifestyle  . Physical activity:    Days per week: Not on file    Minutes per session: Not on file  . Stress: Not on file  Relationships  . Social connections:    Talks on phone: Not on file    Gets together: Not on file    Attends religious service: Not on file    Active member of club or organization: Not on file    Attends meetings of clubs or organizations: Not on file    Relationship status: Not on file  Other Topics Concern  . Not on file  Social History Narrative  . Not on file   Additional Social History:  Sleep: Fair  Appetite:  Fair  Current Medications: Current Facility-Administered Medications  Medication Dose Route Frequency Provider Last Rate Last Dose  . acetaminophen (TYLENOL) tablet 650 mg  650 mg Oral Q6H PRN Laveda Abbe, NP      . alum & mag hydroxide-simeth (MAALOX/MYLANTA) 200-200-20 MG/5ML suspension 30 mL  30 mL Oral Q4H PRN Laveda Abbe, NP      . ARIPiprazole (ABILIFY) tablet 2 mg  2 mg Oral Daily Cobos, Rockey Situ, MD   2 mg at 02/04/18 0827  . ibuprofen (ADVIL,MOTRIN) tablet 600 mg  600 mg Oral Q8H PRN Laveda Abbe, NP      . LORazepam (ATIVAN) tablet 1 mg  1 mg Oral Q8H PRN Cobos, Fernando A, MD      . magnesium hydroxide (MILK OF MAGNESIA) suspension 30 mL  30 mL Oral Daily PRN Laveda Abbe, NP      . ondansetron [facility])  tablet 4 mg  4 mg Oral Q8H PRN Laveda Abbe, NP      . traZODone (DESYREL) tablet 50 mg  50 mg Oral QHS PRN Laveda Abbe, NP   50 mg at 02/02/18 0108  . venlafaxine XR (EFFEXOR-XR) 24 hr capsule 75 mg  75 mg Oral Q breakfast Cobos, Rockey Situ, MD   75 mg at 02/04/18 0827    Lab Results:  Results for orders placed or performed during the hospital encounter of 02/01/18 (from the past 48 hour(s))  TSH     Status: Abnormal   Collection Time: 02/03/18  6:24 AM  Result Value Ref Range   TSH 5.849 (H) 0.350 - 4.500 uIU/mL    Comment: Performed by a 3rd Generation assay with a functional sensitivity of <=0.01 uIU/mL. Performed at [facility], [address], [place], [place] [address]   Hemoglobin A1c     Status: None   Collection Time: 02/03/18  6:24 AM  Result Value Ref Range   Hgb A1c MFr Bld 4.8 4.8 - 5.6 %    Comment: (NOTE) Pre diabetes:          5.7%-6.4% Diabetes:              >6.4% Glycemic control for   <7.0% adults with diabetes    Mean Plasma Glucose 91.06 mg/dL    Comment: Performed at [facility], [address] [place], [place] [address]  Lipid panel     Status: Abnormal   Collection Time: 02/03/18  6:24 AM  Result Value Ref Range   Cholesterol 176 0 - 200 mg/dL   Triglycerides 71 <696 mg/dL   HDL 45 >29 mg/dL   Total CHOL/HDL Ratio 3.9 RATIO   VLDL 14 0 - 40 mg/dL   LDL Cholesterol 528 (H) 0 - 99 mg/dL    Comment:        Total Cholesterol/HDL:CHD Risk Coronary Heart Disease Risk Table                     Men   Women  1/2 Average Risk   3.4   3.3  Average Risk       5.0   4.4  2 X Average Risk   9.6   7.1  3 X Average Risk  23.4   11.0        Use the calculated Patient Ratio above and the CHD Risk Table to determine the patient's CHD Risk.        ATP  III CLASSIFICATION (LDL):  <100     mg/dL   Optimal  161-096  mg/dL   Near or Above                    Optimal  130-159  mg/dL   Borderline  045-409  mg/dL   High   >811     mg/dL   Very High Performed at [facility], [address], [place], [place] [address]   T3, free     Status: None   Collection Time: 02/03/18  6:45 PM  Result Value Ref Range   T3, Free 3.1 2.0 - 4.4 pg/mL    Comment: (NOTE) Performed At: [facility] [address] [place], [place] [id number] Jolene Schimke MD MV:[id number] Performed at [facility], [address], [place], [place] [address]   T4, free     Status: None   Collection Time: 02/03/18  6:45 PM  Result Value Ref Range   Free T4 1.14 0.82 - 1.77 ng/dL    Comment: (NOTE) Biotin ingestion may interfere with free T4 tests. If the results are inconsistent with the TSH level, previous test results, or the clinical presentation, then consider biotin interference. If needed, order repeat testing after stopping biotin. Performed at [facility], [address], [place], [place] [address]     Blood Alcohol level:  Lab Results  Component Value Date   ETH <10 02/01/2018    Metabolic Disorder Labs: Lab Results  Component Value Date   HGBA1C 4.8 02/03/2018   MPG 91.06 02/03/2018   No results found for: PROLACTIN Lab Results  Component Value Date   CHOL 176 02/03/2018   TRIG 71 02/03/2018   HDL 45 02/03/2018   CHOLHDL 3.9 02/03/2018   VLDL 14 02/03/2018   LDLCALC 117 (H) 02/03/2018    Physical Findings: AIMS: Facial and Oral Movements Muscles of Facial Expression: None, normal Lips and Perioral Area: None, normal Jaw: None, normal Tongue: None, normal,Extremity Movements Upper (arms, wrists, hands, fingers): None, normal Lower (legs, knees, ankles, toes): None, normal, Trunk Movements Neck, shoulders, hips: None, normal, Overall Severity Severity of abnormal movements (highest score from questions above): None, normal Incapacitation due to abnormal movements: None, normal Patient's awareness of abnormal movements (rate only patient's report): No  Awareness, Dental Status Current problems with teeth and/or dentures?: No Does patient usually wear dentures?: No  CIWA:  CIWA-Ar Total: 2 COWS:  COWS Total Score: 1  Musculoskeletal: Strength & Muscle Tone: within normal limits Gait & Station: normal Patient leans: N/A  Psychiatric Specialty Exam: Physical Exam  Constitutional: He is oriented to person, place, and time. He appears well-developed and well-nourished.  HENT:  Head: Normocephalic and atraumatic.  Respiratory: Effort normal.  Neurological: He is alert and oriented to person, place, and time.    ROS  Blood pressure 119/88, pulse (!) 102, temperature 98.3 F (36.8 C), temperature source Oral, resp. rate 20, height  (1.803 m), weight 123.4 kg (272 lb), SpO2 100 %.Body mass index is 37.94 kg/m.  General Appearance: Casual  Eye Contact:  Fair  Speech:  Normal Rate  Volume:  Normal  Mood:  Dysphoric  Affect:  Congruent  Thought Process:  Coherent  Orientation:  Full (Time, Place, and Person)  Thought Content:  Logical  Suicidal Thoughts:  No  Homicidal Thoughts:  No  Memory:  Immediate;   Fair  Judgement:  Intact  Insight:  Fair  Psychomotor Activity:  Increased  Concentration:  Concentration: Fair  Recall:  [employer] of Knowledge:  Fair  Language:  Fair  Akathisia:  Negative  Handed:  Right  AIMS (if indicated):     Assets:  Desire for Improvement Financial Resources/Insurance Social Support  ADL's:  Intact  Cognition:  WNL  Sleep:  Number of Hours: 6.25     Treatment Plan Summary: Daily contact with patient to assess and evaluate symptoms and progress in treatment, Medication management and Plan Patient seen and examined.  Patient is a 42 year old male with the above-stated past psychiatric history seen in follow-up.  He continues on Abilify 2 mg p.o. daily and Effexor XR 75 mg p.o. daily.  I am to continue his medications aware that they are.  He denied current suicidal ideation today.  He also  has hypothyroidism, and that is being treated and adjusted.  We will see how things go over the next day or so.  Antonieta Pert, MD 02/04/2018, 11:29 AM

## 2018-02-04 NOTE — Progress Notes (Signed)
Adult Psychoeducational Group Note  Date:  02/04/2018 Time:  1:50 PM  Group Topic/Focus:  Goals Group:   The focus of this group is to help patients establish daily goals to achieve during treatment and discuss how the patient can incorporate goal setting into their daily lives to aide in recovery.  Participation Level:  Active  Participation Quality:  Appropriate  Affect:  Appropriate  Cognitive:  Appropriate  Insight: Good  Engagement in Group:  Engaged  Modes of Intervention:  Education  Additional Comments:  Patient was active in group   [name], Richard Lara 02/04/2018, 1:50 PM

## 2018-02-05 NOTE — Progress Notes (Signed)
[facility] MD Progress Note  02/05/2018 10:49 AM Richard Lara  MRN:  [id number] Subjective: Patient is seen and examined.  Patient is a 42 year old male with a reported history of posttraumatic stress disorder who was seen in follow-up.  He stated he felt better today.  He denied any suicidal ideation.  He is more focused on trying to get back to work.  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  He is tolerating the Effexor and Abilify at this point.  He denied any suicidal ideation.  We discussed the need for social work to try and arrange follow-up with appointments in the [employer] system. Principal Problem: <principal problem not specified> Diagnosis:   Patient Active Problem List   Diagnosis Date Noted  . MDD (major depressive disorder), single episode, severe , no psychosis (HCC) [F32.2] 02/01/2018   Total Time spent with patient: 20 minutes  Past Psychiatric History: See admission H&P  Past Medical History:  Past Medical History:  Diagnosis Date  . CAD (coronary artery disease)   . Depression   . PTSD (post-traumatic stress disorder)   . Thyroid disease     Past Surgical History:  Procedure Laterality Date  . ACHILLES TENDON SURGERY     Family History: History reviewed. No pertinent family history. Family Psychiatric  History: See admission H&P Social History:  Social History   Substance and Sexual Activity  Alcohol Use Yes     Social History   Substance and Sexual Activity  Drug Use No    Social History   Socioeconomic History  . Marital status: Married    Spouse name: Not on file  . Number of children: Not on file  . Years of education: Not on file  . Highest education level: Not on file  Occupational History  . Not on file  Social Needs  . Financial resource strain: Not on file  . Food insecurity:    Worry: Not on file    Inability: Not on file  .  Transportation needs:    Medical: Not on file    Non-medical: Not on file  Tobacco Use  . Smoking status: Never Smoker  . Smokeless tobacco: Never Used  Substance and Sexual Activity  . Alcohol use: Yes  . Drug use: No  . Sexual activity: Not on file  Lifestyle  . Physical activity:    Days per week: Not on file    Minutes per session: Not on file  . Stress: Not on file  Relationships  . Social connections:    Talks on phone: Not on file    Gets together: Not on file    Attends religious service: Not on file    Active member of club or organization: Not on file    Attends meetings of clubs or organizations: Not on file    Relationship status: Not on file  Other Topics Concern  . Not on file  Social History Narrative  . Not on file   Additional Social History:                         Sleep: Good  Appetite:  Fair  Current Medications: Current Facility-Administered Medications  Medication Dose Route Frequency Provider Last Rate Last Dose  . acetaminophen (TYLENOL) tablet 650 mg  650 mg Oral Q6H PRN Laveda Abbe, NP      .  alum & mag hydroxide-simeth (MAALOX/MYLANTA) 200-200-20 MG/5ML suspension 30 mL  30 mL Oral Q4H PRN Laveda Abbe, NP      . ARIPiprazole (ABILIFY) tablet 2 mg  2 mg Oral Daily Cobos, Rockey Situ, MD   2 mg at 02/05/18 0742  . ibuprofen (ADVIL,MOTRIN) tablet 600 mg  600 mg Oral Q8H PRN Laveda Abbe, NP      . LORazepam (ATIVAN) tablet 1 mg  1 mg Oral Q8H PRN Cobos, Fernando A, MD      . magnesium hydroxide (MILK OF MAGNESIA) suspension 30 mL  30 mL Oral Daily PRN Laveda Abbe, NP      . ondansetron [facility]) tablet 4 mg  4 mg Oral Q8H PRN Laveda Abbe, NP      . traZODone (DESYREL) tablet 50 mg  50 mg Oral QHS PRN Laveda Abbe, NP   50 mg at 02/02/18 0108  . venlafaxine XR (EFFEXOR-XR) 24 hr capsule 75 mg  75 mg Oral Q breakfast Cobos, Rockey Situ, MD   75 mg at 02/05/18 1610    Lab Results:   Results for orders placed or performed during the hospital encounter of 02/01/18 (from the past 48 hour(s))  T3, free     Status: None   Collection Time: 02/03/18  6:45 PM  Result Value Ref Range   T3, Free 3.1 2.0 - 4.4 pg/mL    Comment: (NOTE) Performed At: [facility] [address] [place], [place] [id number] Jolene Schimke MD [id number]:[id number] Performed at [facility], [address] [place], [place] [address]   T4, free     Status: None   Collection Time: 02/03/18  6:45 PM  Result Value Ref Range   Free T4 1.14 0.82 - 1.77 ng/dL    Comment: (NOTE) Biotin ingestion may interfere with free T4 tests. If the results are inconsistent with the TSH level, previous test results, or the clinical presentation, then consider biotin interference. If needed, order repeat testing after stopping biotin. Performed at [facility], [address] [place], [place] [address]     Blood Alcohol level:  Lab Results  Component Value Date   ETH <10 02/01/2018    Metabolic Disorder Labs: Lab Results  Component Value Date   HGBA1C 4.8 02/03/2018   MPG 91.06 02/03/2018   No results found for: PROLACTIN Lab Results  Component Value Date   CHOL 176 02/03/2018   TRIG 71 02/03/2018   HDL 45 02/03/2018   CHOLHDL 3.9 02/03/2018   VLDL 14 02/03/2018   LDLCALC 117 (H) 02/03/2018    Physical Findings: AIMS: Facial and Oral Movements Muscles of Facial Expression: None, normal Lips and Perioral Area: None, normal Jaw: None, normal Tongue: None, normal,Extremity Movements Upper (arms, wrists, hands, fingers): None, normal Lower (legs, knees, ankles, toes): None, normal, Trunk Movements Neck, shoulders, hips: None, normal, Overall Severity Severity of abnormal movements (highest score from questions above): None, normal Incapacitation due to abnormal movements: None, normal Patient's awareness of abnormal movements (rate only patient's report): No  Awareness, Dental Status Current problems with teeth and/or dentures?: No Does patient usually wear dentures?: No  CIWA:  CIWA-Ar Total: 2 COWS:  COWS Total Score: 1  Musculoskeletal: Strength & Muscle Tone: within normal limits Gait & Station: normal Patient leans: N/A  Psychiatric Specialty Exam: Physical Exam  Constitutional: He is oriented to person, place, and time. He appears well-developed and well-nourished.  HENT:  Head: Normocephalic.  Respiratory: Effort normal.  Neurological: He is alert and oriented to person, place, and time.    ROS  Blood pressure (!) 123/100, pulse (!) 102, temperature 98 F (36.7 C), temperature source Oral, resp. rate 20, height  (1.803 m), weight 123.4 kg (272 lb), SpO2 100 %.Body mass index is 37.94 kg/m.  General Appearance: Casual  Eye Contact:  Fair  Speech:  Clear and Coherent  Volume:  Normal  Mood:  Euthymic  Affect:  Appropriate  Thought Process:  Coherent  Orientation:  Full (Time, Place, and Person)  Thought Content:  Logical  Suicidal Thoughts:  No  Homicidal Thoughts:  No  Memory:  Immediate;   Fair  Judgement:  Intact  Insight:  Fair  Psychomotor Activity:  Normal  Concentration:  Concentration: Fair  Recall:  Fair  Fund of Knowledge:  Good  Language:  Fair  Akathisia:  Negative  Handed:  Right  AIMS (if indicated):     Assets:  Communication Skills Desire for Improvement Financial Resources/Insurance Housing Physical Health Resilience Social Support  ADL's:  Intact  Cognition:  WNL  Sleep:  Number of Hours: 6.25     Treatment Plan Summary: Daily contact with patient to assess and evaluate symptoms and progress in treatment, Medication management and Plan Patient is seen and examined.  Patient is a 42 year old male with the above-stated past psychiatric history seen in follow-up.  He continues to improve on Abilify 2 mg p.o. daily and venlafaxine XR 75 mg p.o. daily.  He denied suicidal ideation today.   We will watch him over the next 24 hours, and then plan for discharge tomorrow morning.  Antonieta Pert, MD 02/05/2018, 10:49 AM

## 2018-02-05 NOTE — [facility] Suicide Risk Assessment (Signed)
[facility] INPATIENT:  Family/Significant Other Suicide Prevention Education  Suicide Prevention Education:  Contact Attempts: Wife, Quatavious Rossa - [phone], (name of family member/significant other) has been identified by the patient as the family member/significant other with whom the patient will be residing, and identified as the person(s) who will aid the patient in the event of a mental health crisis.  With written consent from the patient, two attempts were made to provide suicide prevention education, prior to and/or following the patient's discharge.  We were unsuccessful in providing suicide prevention education.  A suicide education pamphlet was given to the patient to share with family/significant other.  Date and time of first attempt: 5/19 at 3:56pm  Date and time of second attempt: CSW will continue to follow up.   Shellia Cleverly 02/05/2018, 3:56 PM

## 2018-02-05 NOTE — [facility] Group Notes (Signed)
[facility] LCSW Group Therapy Note  02/05/2018  10:00-11:00AM  Type of Therapy and Topic:  Group Therapy:  Building Supports  Participation Level:  Minimal   Description of Group:  Patients in this group were introduced to the idea of adding a variety of healthy supports to address the various needs in their lives.  Different types of support were defined and described, and patients were asked to act out what each type could be.  Patients discussed what additional healthy supports could be helpful in their recovery and wellness after discharge in order to prevent future hospitalizations.   An emphasis was placed on following up with the discharge plan when they leave the hospital in order to continue becoming healthier and happier.  Therapeutic Goals: 1)  demonstrate the importance of adding supports  2)  discuss 4 definitions of support  3)  identify the patient's current level of healthy support and   4)  elicit commitments to add one healthy support   Summary of Patient Progress:  Patient was present throughout the session. Patient reported at the beginning of the session his wife and children are his healthy supports. Patient denied any unhealthy supports in his life. Patient would put his head in his hands and say inaudible words from time to time during group. Patient appeared to respond to something his peer was saying as evidenced by shaking his head in a yes motion repeatedly. Patient completed the worksheet and got pencils for some of his peers.   Therapeutic Modalities:   Motivational Interviewing Brief Solution-Focused Therapy  Shellia Cleverly

## 2018-02-05 NOTE — Progress Notes (Signed)
D: Patient presents depressed, mildly anxious, cooperative. Patient continues to be pleasant, and is showing improved mood since admission. 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 He lives with his wife who is supportive. Sleep last night was poor, "I slept only 3 hours," and patient did not receive PRN medication. Appetite is fair, energy level normal, concentration good. Depression he rated 4/10, hopelessness 2/10, and anxiety 6/10. Patient denies withdrawal or physical symptoms. Patient denies SI/HI/AVH.  A: Patient checked q15 min, and checks reviewed. Reviewed medication changes with patient and educated on side effects. Educated patient on importance of attending group therapy sessions and educated on several coping skills. Encouarged participation in milieu through recreation therapy and attending meals with peers.  R: Patient receptive to education on medications, and is medication compliant. Patient attending all group therapy, recreation therapy sessions, and cafeteria with peers. Patient set goal to work on "discharge plan" and "getting one in place." Patient contracts for safety on the unit.

## 2018-02-05 NOTE — [facility] Group Notes (Signed)
[facility] Group Notes:  (Nursing/MHT/Case Management/Adjunct)  Date:  02/05/2018  Time:  6:30 PM  Type of Therapy:  Psychoeducational Skills  Participation Level:  Minimal  Participation Quality:  Inattentive  Affect:  Anxious  Cognitive:  Hallucinating  Insight:  Limited  Engagement in Group:  Distracting and Lacking  Modes of Intervention:  Discussion and Socialization  Summary of Progress/Problems: Patient was holding head during group, muttering to internal stimuli throughout. Unclear whether he was hearing voices or he was anxious and disagreeing with staff. Appears to be some attention seeking, as he seemed to respond appropriately when asked if he was "okay." He would reply "yes" and then apologize. Offered support and encouragement.   Kirstie Mirza 02/05/2018, 6:30 PM

## 2018-02-05 NOTE — Progress Notes (Signed)
Adult Psychoeducational Group Note  Date:  02/05/2018 Time:  2:36 AM  Group Topic/Focus:  Wrap-Up Group:   The focus of this group is to help patients review their daily goal of treatment and discuss progress on daily workbooks.   Participation Level:  Active  Participation Quality:  Appropriate  Affect:  Appropriate  Cognitive:  Appropriate  Insight: Appropriate  Engagement in Group:  Engaged  Modes of Intervention:  Discussion  Additional Comments:  Pt stated his goal for today was meet with his social worker to discuss his after care plan. Pt stated he did not accomplished this today but planned to speak with the Social worker tomorrow. Pt rated his over all day a 5 out 10. Pt stated he attend all groups held today. Pt stated he enjoyed the group held by counselor today.  Felipa Furnace 02/05/2018, 2:36 AM

## 2018-02-06 MED ORDER — VENLAFAXINE HCL ER 75 MG PO CP24: 75.0000 mg | ORAL_CAPSULE | Freq: Every day | ORAL | 0 refills | Status: AC

## 2018-02-06 MED ORDER — TRAZODONE HCL 50 MG PO TABS: 50.0000 mg | ORAL_TABLET | Freq: Every evening | ORAL | 0 refills | Status: AC | PRN

## 2018-02-06 MED ORDER — ARIPIPRAZOLE 2 MG PO TABS: 2.0000 mg | ORAL_TABLET | Freq: Every day | ORAL | 0 refills | Status: AC

## 2018-02-06 NOTE — [facility] Suicide Risk Assessment (Signed)
[facility] INPATIENT:  Family/Significant Other Suicide Prevention Education  Suicide Prevention Education:  Education Completed; Anakin Varkey, wife, [phone], has been identified by the patient as the family member/significant other with whom the patient will be residing, and identified as the person(s) who will aid the patient in the event of a mental health crisis (suicidal ideations/suicide attempt).  With written consent from the patient, the family member/significant other has been provided the following suicide prevention education, prior to the and/or following the discharge of the patient.  The suicide prevention education provided includes the following:  Suicide risk factors  Suicide prevention and interventions  National Suicide Hotline telephone number  [facility] assessment telephone number  [facility] Emergency Assistance 911  [facility] and/or Residential Mobile Crisis Unit telephone number  Request made of family/significant other to:  Remove weapons (e.g., guns, rifles, knives), all items previously/currently identified as safety concern.  No guns in the home, per [place].  Remove drugs/medications (over-the-counter, prescriptions, illicit drugs), all items previously/currently identified as a safety concern.  The family member/significant other verbalizes understanding of the suicide prevention education information provided.  The family member/significant other agrees to remove the items of safety concern listed above.  Lorri Frederick, LCSW 02/06/2018, 9:42 AM

## 2018-02-06 NOTE — Progress Notes (Signed)
Adult Psychoeducational Group Note  Date:  02/06/2018 Time:  9:04 AM  Group Topic/Focus:  Orientation:   The focus of this group is to educate the patient on the purpose and policies of crisis stabilization and provide a format to answer questions about their admission.  The group details unit policies and expectations of patients while admitted.  Participation Level:  Active  Participation Quality:  Appropriate  Affect:  Appropriate  Cognitive:  Appropriate  Insight: Appropriate  Engagement in Group:  Engaged  Modes of Intervention:  Discussion  Additional Comments:  Pt attended group ad was curious about the topic of the group. I gave him the task of thinking of what wellness looks like in his life to share later.   Richard Lara 02/06/2018, 9:04 AM

## 2018-02-06 NOTE — Progress Notes (Signed)
  [facility] Adult Case Management Discharge Plan :  Will you be returning to the same living situation after discharge:  Yes,  with wife At discharge, do you have transportation home?: No.Pt will take taxi. Do you have the ability to pay for your medications: Yes,  tricare  Release of information consent forms completed and in the chart;  Patient's signature needed at discharge.  Patient to Follow up at: Follow-up Information    Clinic, [facility] Go on 02/09/2018.   Why:  Please attend your hospital discharge appt on Thursday, 02/09/18, at 8:00am.  Please attend your therapy appt with Dr Paticia Stack on Tuesday, 02/14/18, at 11:00am.  Please attend your medication appt on Wednesday, 03/08/18, at 8:00am. Contact information: [address] [facility] [place] [address] [phone]           Next level of care provider has access to [facility] Link:no  Safety Planning and Suicide Prevention discussed: Yes,  with wife and pt     Has patient been referred to the Quitline?: N/A patient is not a smoker  Patient has been referred for addiction treatment: N/A  Lorri Frederick, LCSW 02/06/2018, 9:44 AM

## 2018-02-06 NOTE — Progress Notes (Signed)
Writer spoke with patient briefly since he had already laid down for the night. He reports that his day was productive and he was able to speak to a doctor about his concerns. He is hopeful to discharge on tomorrow in time to be prepared for work. Support given and safety maintained on unit with 15 min checks.

## 2018-02-06 NOTE — [facility] Suicide Risk Assessment (Signed)
[facility] Discharge Suicide Risk Assessment   Principal Problem: depression, anxiety  Discharge Diagnoses:  Patient Active Problem List   Diagnosis Date Noted  . MDD (major depressive disorder), single episode, severe , no psychosis (HCC) [F32.2] 02/01/2018    Total Time spent with patient: 30 minutes  Musculoskeletal: Strength & Muscle Tone: within normal limits Gait & Station: normal Patient leans: N/A  Psychiatric Specialty Exam: ROS no headache, no chest pain, no shortness of breath, no vomiting   Blood pressure (!) 124/96, pulse 95, temperature 98.6 F (37 C), temperature source Oral, resp. rate 20, height  (1.803 m), weight 123.4 kg (272 lb), SpO2 100 %.Body mass index is 37.94 kg/m.  General Appearance: improved grooming   Eye Contact::  Good  Speech:  Normal Rate409  Volume:  Normal  Mood:  improved , states he feels better   Affect:  less anxious  Thought Process:  Linear and Descriptions of Associations: Intact  Orientation:  Full (Time, Place, and Person)  Thought Content:  denies hallucinations, no delusions, not internally preoccupied  Suicidal Thoughts:  No denies suicidal or self injurious ideations, denies any homicidal or violent ideations   Homicidal Thoughts:  No  Memory:  recent and remote grossly intact   Judgement:  Other:  improving  Insight:  improving   Psychomotor Activity:  Normal  Concentration:  Good  Recall:  Good  Fund of Knowledge:Good  Language: Good  Akathisia:  Negative  Handed:  Right  AIMS (if indicated):     Assets:  Communication Skills Resilience  Sleep:  Number of Hours: 6  Cognition: WNL  ADL's:  Intact   Mental Status Per Nursing Assessment::   On Admission:     Demographic Factors:  42 year old married male, two children,employed, [employer] veteran  Loss Factors: Work related stress   Historical Factors: Two prior psychiatric admissions for depression, history of anxiety, no history of suicide attempts   Risk Reduction  Factors:   Responsible for children under 78 years of age, Sense of responsibility to family, Employed, Living with another person, especially a relative, Positive social support and Positive coping skills or problem solving skills  Continued Clinical Symptoms:  At this time patient is alert, attentive, reports mood improved, states he feels better, affect still anxious but to lesser degree than on admission, today smiles at times during session, no thought disorder, no suicidal or self injurious ideations, no homicidal or violent ideations , identifies love for his children as protective factor against suicide, no hallucinations, no delusions, not internally preoccupied, future oriented ,states he is looking forward to returning to work ," I don't want to fall behind and I feel the longer I stay out of work the more anxious I will be ". 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  Denies medication side effects. We reviewed medication side effects, including symptoms of venlafaxine WDL if stopped abruptly and potential for increased BP.  Patient has had mildly elevated BP readings, states he feels it may be anxiety and being in inpatient environment, plans to continue to monitor regularly and to follow up with his PCP.  Behavior on unit in good control. Pleasant on approach.      Cognitive Features That Contribute To Risk:  No gross cognitive deficits noted upon discharge. Is alert , attentive, and oriented x 3    Suicide Risk:  Mild:  Suicidal ideation of limited frequency, intensity, duration, and specificity.  There are no identifiable plans,  no associated intent, mild dysphoria and related symptoms, good self-control (both objective and subjective assessment), few other risk factors, and identifiable protective factors, including available and accessible social support.    Plan Of Care/Follow-up recommendations:  Activity:  as tolerated  Diet:  regular Tests:  NA Other:   See below  Patient is requesting discharge and there are no current grounds for involuntary commitment  Plans to return home Plans to return to work later this week. Will follow up at [facility] clinic for medication management and psychotherapy, also has an established PCP there for medical management as needed .  Craige Cotta, MD 02/06/2018, 8:04 AM

## 2018-02-06 NOTE — Progress Notes (Signed)
D: Pt A & O X 3. Denies SI, HI, AVH and pain at this time. D/C home as ordered. Patient reports feeling only mildly depressed, but feels ready to return to work as he has seen significant improvement since admission. [facility] Patient continues to experience mild anxiety, that has improved since admission. Picked up at entrance by [employer]. A: D/C instructions reviewed with pt including prescriptions, diagnosis, and follow up appointment, compliance encouraged. All belongings from locker #30 given to pt at time of departure. Scheduled and PRN medications given with verbal education and effects monitored. Safety checks maintained without incident till time of d/c. Reviewed abdominal ultrasound with patient. R: Pt receptive to care. Compliant with medications when offered. Denies adverse drug reactions when assessed. Verbalized understanding related to d/c instructions. Signed belonging sheet in agreement with items received from locker. Ambulatory with a steady gait. Appears to be in no physical distress at time of departure.

## 2018-02-23 NOTE — Discharge Summary (Addendum)
Physician Discharge Summary Note  Patient:  Richard Lara is an 42 y.o., male MRN:  [id number] DOB:  18-Mar-1976 Patient phone:  [phone] (home)  Patient address:   [address] [place] [address],  Total Time spent with patient: 45 minutes  Date of Admission:  02/01/2018 Date of Discharge: 02/06/2018  Reason for Admission:  " 42 year old married male . [facility] , [facility]. Reports worsening depression and anxiety, particularly over the last two weeks. He reports this was partially related to work stressors : " a recent change in my  contract" which led to feeling overwhelmed.  He does report history of chronic mood disorder .  States he has had recent suicidal ideations, with thoughts of shooting self . States " I feel stuck". He endorses neuro-vegetative symptoms as below. Denies psychotic symptoms. In addition to depression, he reports  increased anxiety, mainly described as social anxiety type symptoms, states he feels very anxious when interacting with others, particularly if feeling judged, " like they are going to think something bad about me, or I am going say something that does not come out right". 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  "  Principal Problem: MDD (major depressive disorder), single episode, severe , no psychosis (HCC) Discharge Diagnoses: Patient Active Problem List   Diagnosis Date Noted  . MDD (major depressive disorder), single episode, severe , no psychosis (HCC) [F32.2] 02/01/2018    Past Psychiatric History: see H&P  Past Medical History:  Past Medical History:  Diagnosis Date  . CAD (coronary artery disease)   . Depression   . PTSD (post-traumatic stress disorder)   . Thyroid disease     Past Surgical History:  Procedure Laterality Date  . ACHILLES TENDON SURGERY     Family History: History reviewed. No pertinent family history. Family Psychiatric  History: see  H&P Social History:  Social History   Substance and Sexual Activity  Alcohol Use Yes     Social History   Substance and Sexual Activity  Drug Use No    Social History   Socioeconomic History  . Marital status: Married    Spouse name: Not on file  . Number of children: Not on file  . Years of education: Not on file  . Highest education level: Not on file  Occupational History  . Not on file  Social Needs  . Financial resource strain: Not on file  . Food insecurity:    Worry: Not on file    Inability: Not on file  . Transportation needs:    Medical: Not on file    Non-medical: Not on file  Tobacco Use  . Smoking status: Never Smoker  . Smokeless tobacco: Never Used  Substance and Sexual Activity  . Alcohol use: Yes  . Drug use: No  . Sexual activity: Not on file  Lifestyle  . Physical activity:    Days per week: Not on file    Minutes per session: Not on file  . Stress: Not on file  Relationships  . Social connections:    Talks on phone: Not on file    Gets together: Not on file    Attends religious service: Not on file    Active member of club or organization: Not on file    Attends meetings of clubs or organizations: Not on file    Relationship status: Not on file  Other Topics Concern  . Not on  file  Social History Narrative  . Not on file    Hospital Course:  Richard Lara was admitted for MDD (major depressive disorder), single episode, severe , no psychosis (HCC) , and crisis management.  Pt was treated discharged with the medications listed below under Medication List.  Medical problems were identified and treated as needed.  Home medications were restarted as appropriate.  Improvement was monitored by observation and Richard Lara 's daily report of symptom reduction.  Emotional and mental status was monitored by daily self-inventory reports completed by Richard Lara and clinical staff.         Richard Lara was evaluated by the treatment team for stability  and plans for continued recovery upon discharge. Richard Lara 's motivation was an integral factor for scheduling further treatment. Employment, transportation, bed availability, health status, family support, and any pending legal issues were also considered during hospital stay. Pt was offered further treatment options upon discharge including but not limited to Residential, Intensive Outpatient, and Outpatient treatment.  Richard Lara will follow up with the services as listed below under Follow Up Information.     Upon completion of this admission the patient was both mentally and medically stable for discharge denying suicidal/homicidal ideation, auditory/visual/tactile hallucinations, delusional thoughts and paranoia.    Richard Lara responded well to treatment with abilify, trazodone, effexor without adverse effects. Pt demonstrated improvement without reported or observed adverse effects to the point of stability appropriate for outpatient management. Pertinent labs include: LDL 117H,  UDS negative,  for which outpatient follow-up is necessary for lab recheck as mentioned below. Reviewed CBC, CMP, BAL, and UDS; all unremarkable aside from noted exceptions.    Physical Findings: AIMS: Facial and Oral Movements Muscles of Facial Expression: None, normal Lips and Perioral Area: None, normal Jaw: None, normal Tongue: None, normal,Extremity Movements Upper (arms, wrists, hands, fingers): None, normal Lower (legs, knees, ankles, toes): None, normal, Trunk Movements Neck, shoulders, hips: None, normal, Overall Severity Severity of abnormal movements (highest score from questions above): None, normal Incapacitation due to abnormal movements: None, normal Patient's awareness of abnormal movements (rate only patient's report): No Awareness, Dental Status Current problems with teeth and/or dentures?: No Does patient usually wear dentures?: No  CIWA:  CIWA-Ar Total: 0 COWS:  COWS Total Score:  0  Musculoskeletal: Strength & Muscle Tone: within normal limits Gait & Station: normal Patient leans: N/A  Psychiatric Specialty Exam: Physical Exam  Review of Systems  Psychiatric/Behavioral: Positive for depression. Negative for hallucinations, substance abuse and suicidal ideas. The patient is not nervous/anxious and does not have insomnia.   All other systems reviewed and are negative.   Blood pressure (!) 124/96, pulse 95, temperature 98.6 F (37 C), temperature source Oral, resp. rate 20, height 5\' 11"  (1.803 m), weight 123.4 kg (272 lb), SpO2 100 %.Body mass index is 37.94 kg/m.  SEE MD PSE WITHIN SRA     Has this patient used any form of tobacco in the last 30 days? (Cigarettes, Smokeless Tobacco, Cigars, and/or Pipes)  NO  Blood Alcohol level:  Lab Results  Component Value Date   ETH <10 02/01/2018    Metabolic Disorder Labs:  Lab Results  Component Value Date   HGBA1C 4.8 02/03/2018   MPG 91.06 02/03/2018   No results found for: PROLACTIN Lab Results  Component Value Date   CHOL 176 02/03/2018   TRIG 71 02/03/2018   HDL 45 02/03/2018   CHOLHDL 3.9 02/03/2018   VLDL 14 02/03/2018   LDLCALC  117 (H) 02/03/2018    See Psychiatric Specialty Exam and Suicide Risk Assessment completed by Attending Physician prior to discharge.  Discharge destination:  Home  Is patient on multiple antipsychotic therapies at discharge:  No   Has Patient had three or more failed trials of antipsychotic monotherapy by history:  No  Recommended Plan for Multiple Antipsychotic Therapies: NA  Discharge Instructions    Diet - low sodium heart healthy   Complete by:  As directed    Discharge instructions   Complete by:  As directed    Take all medications as prescribed. Keep all follow-up appointments as scheduled.  Do not consume alcohol or use illegal drugs while on prescription medications. Report any adverse effects from your medications to your primary care provider  promptly.  In the event of recurrent symptoms or worsening symptoms, call 911, a crisis hotline, or go to the nearest emergency department for evaluation.   Increase activity slowly   Complete by:  As directed      Allergies as of 02/06/2018   No Known Allergies     Medication List    STOP taking these medications   ibuprofen 200 MG tablet Commonly known as:  ADVIL,MOTRIN     TAKE these medications     Indication  ARIPiprazole 2 MG tablet Commonly known as:  ABILIFY Take 1 tablet (2 mg total) by mouth daily.  Indication:  Major Depressive Disorder   traZODone 50 MG tablet Commonly known as:  DESYREL Take 1 tablet (50 mg total) by mouth at bedtime as needed for sleep.  Indication:  Trouble Sleeping   venlafaxine XR 75 MG 24 hr capsule Commonly known as:  EFFEXOR-XR Take 1 capsule (75 mg total) by mouth daily with breakfast.  Indication:  Major Depressive Disorder      Follow-up Information    [facility].   Why:  Please attend your hospital discharge appt on Thursday, 02/09/18, at 8:00am.  Please attend your therapy appt with Dr Paticia Stackuthiers on Tuesday, 02/14/18, at 11:00am.  Please attend your medication appt on Wednesday, 03/08/18, at 8:00am. Contact information: [address] [place] [address] [phone]           Follow-up recommendations:  Activity:  As tolerated Diet:  Heart healthy with low sodium.  Comments:  Take all medications as prescribed. Keep all follow-up appointments as scheduled.  Do not consume alcohol or use illegal drugs while on prescription medications. Report any adverse effects from your medications to your primary care provider promptly.  In the event of recurrent symptoms or worsening symptoms, call 911, a crisis hotline, or go to the nearest emergency department for evaluation.    Signed: Beau FannyWithrow, John C, FNP 02/23/2018, 12:21 AM   Patient seen, Suicide Assessment Completed.   Disposition Plan Reviewed

## 2018-11-03 IMAGING — US US ABDOMEN LIMITED
1 series · 14 of 25 positions shown · non-contrast
Comparison: None.

CLINICAL DATA: Elevated liver function tests.

EXAM:
ULTRASOUND ABDOMEN LIMITED RIGHT UPPER QUADRANT

[Series 1: us abdomen limited · 14 of 55 slices shown]
[im 1/55]
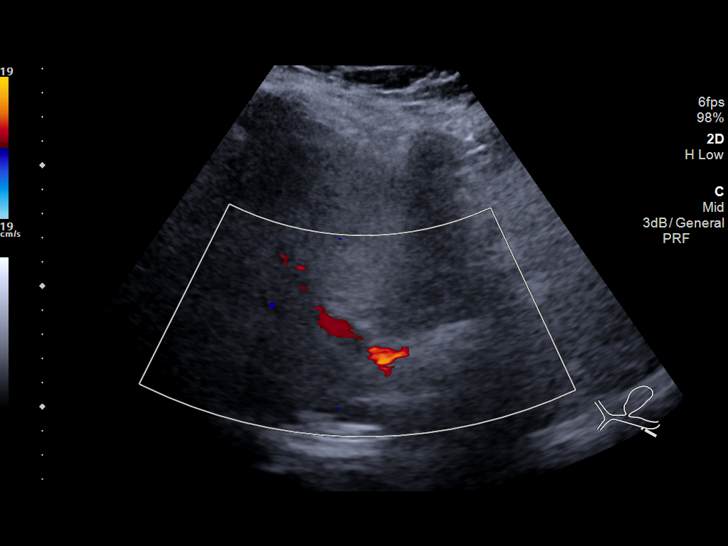
[im 5/55]
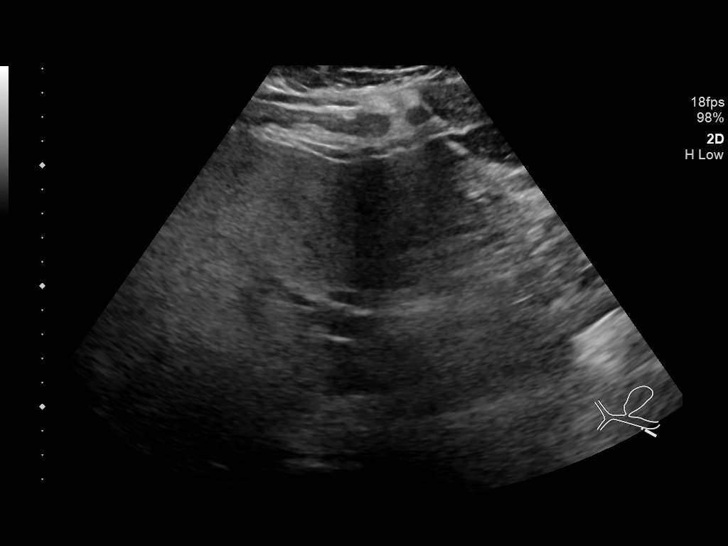
[im 10/55]
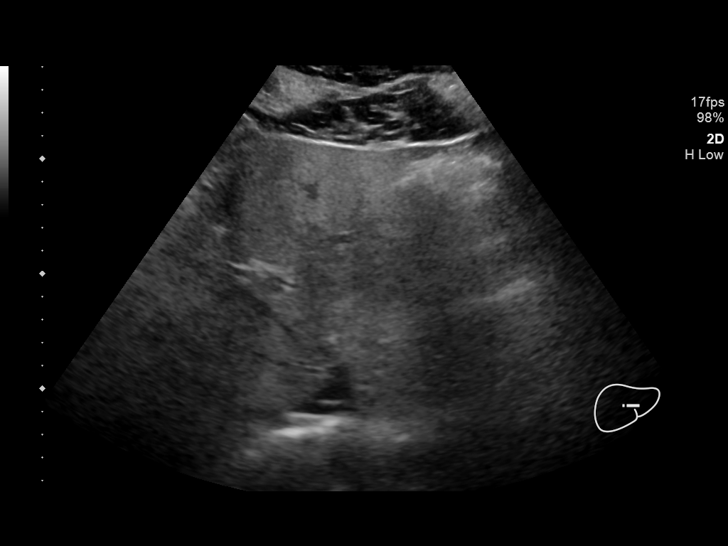
[im 14/55]
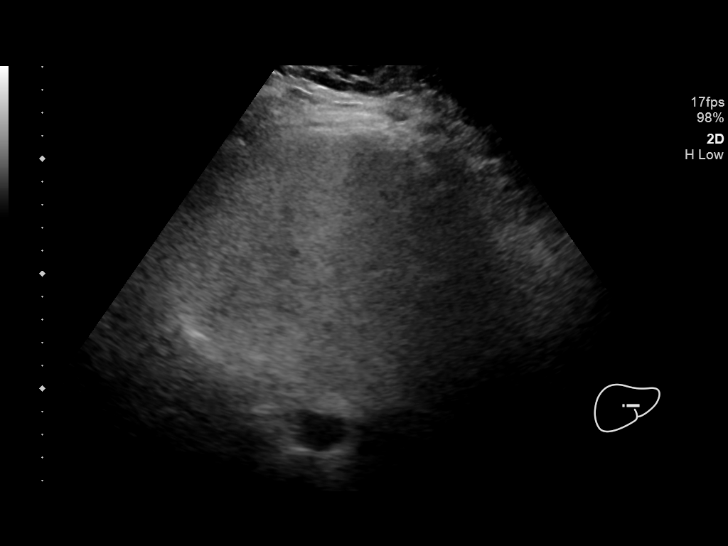
[im 19/55]
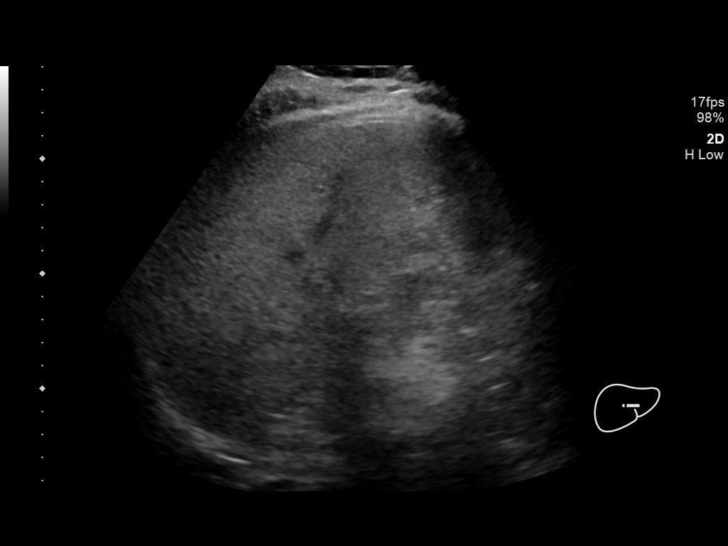
[im 21/55]
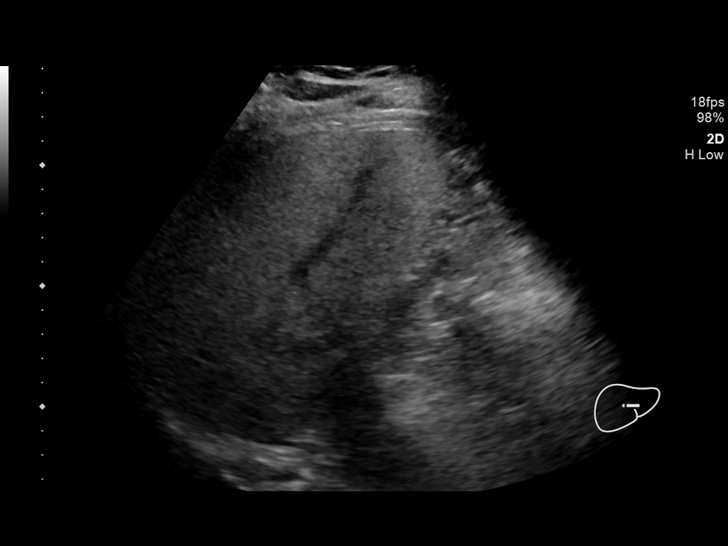
[im 25/55]
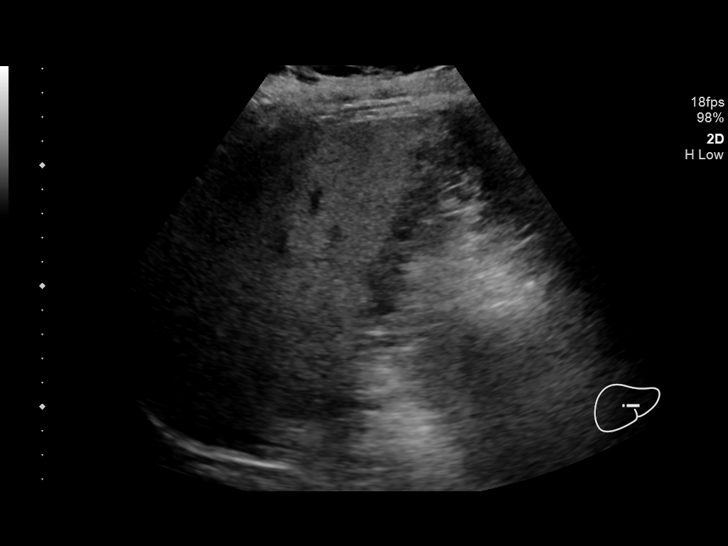
[im 30/55]
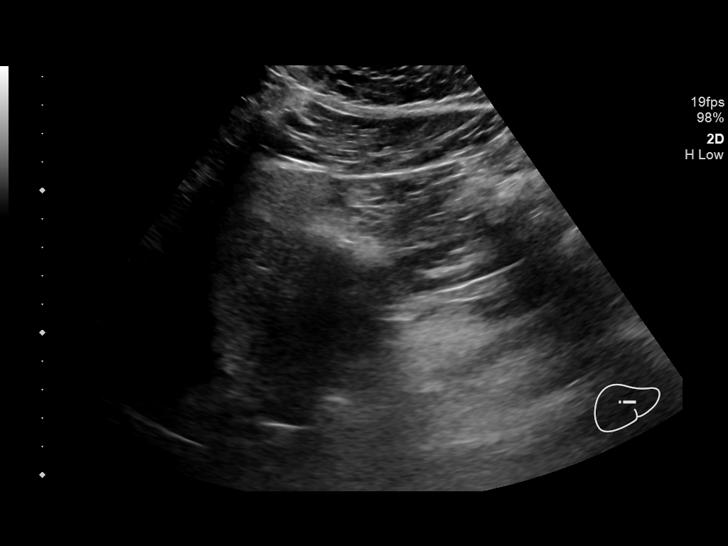
[im 34/55]
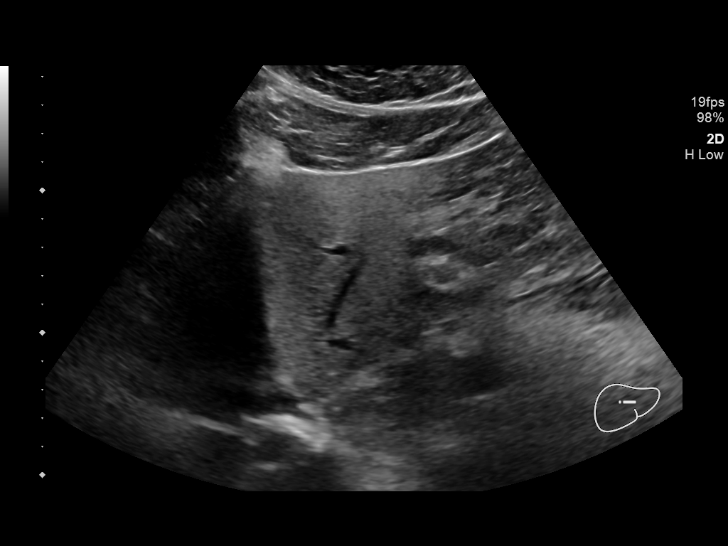
[im 37/55]
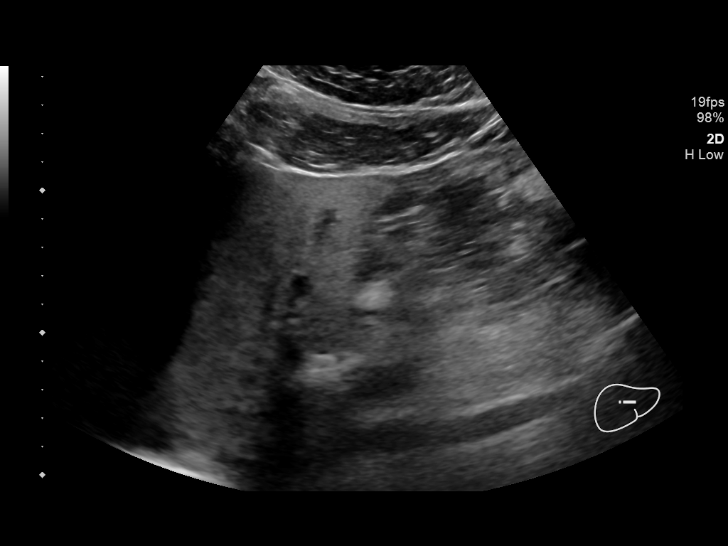
[im 41/55]
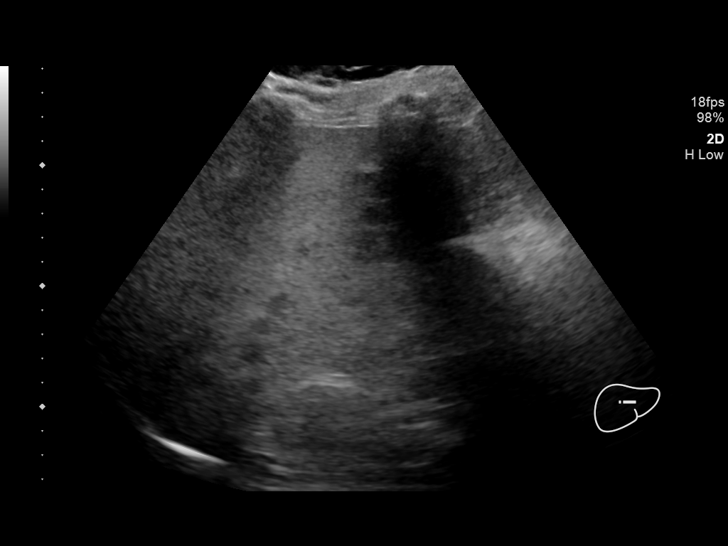
[im 46/55]
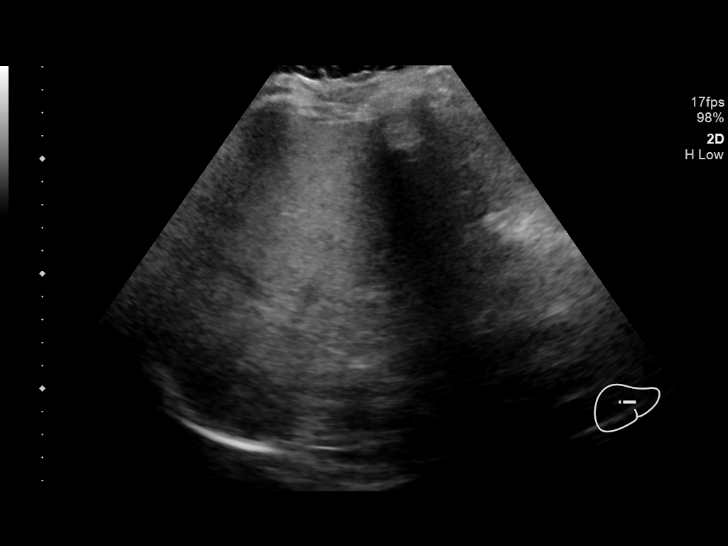
[im 50/55]
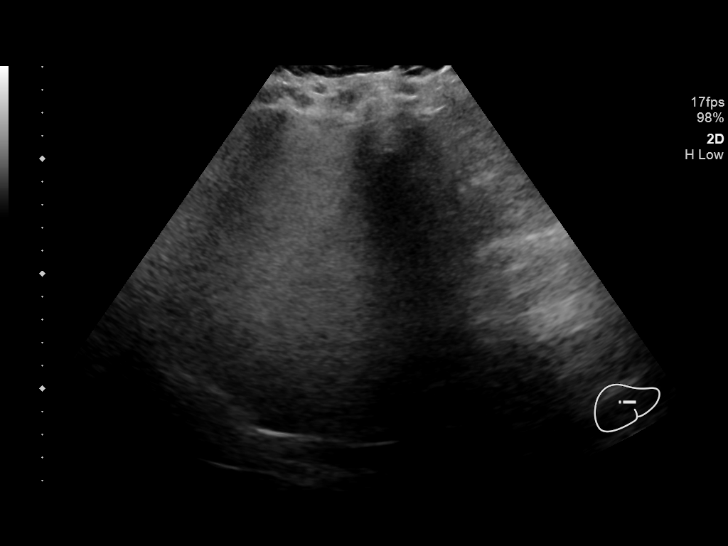
[im 55/55]
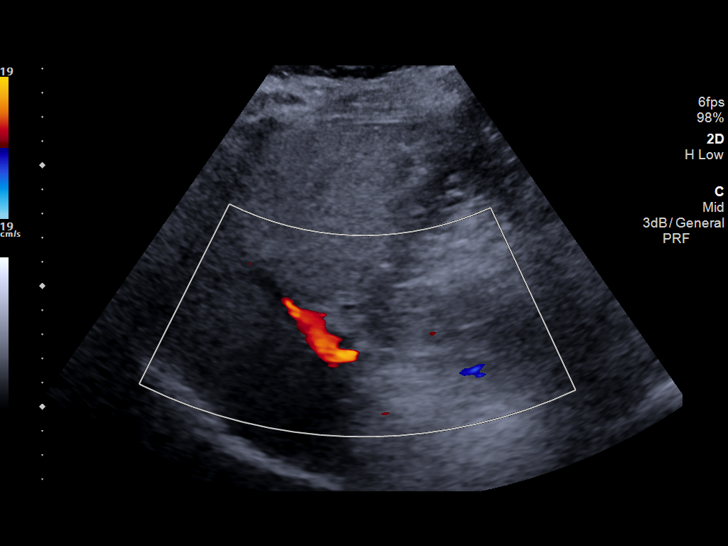

[14 of 25 positions shown; findings below may reference images not displayed]

FINDINGS: Gallbladder:

Surgically absent.

Common bile duct:

Diameter: 5 mm

Liver:

Moderately increased parenchymal echogenicity and heterogeneity
without a discrete lesion identified. Portal vein is patent on color
Doppler imaging with normal direction of blood flow towards the
liver.
IMPRESSION: 1. Echogenic liver, nonspecific but may reflect steatosis.
2. No biliary dilatation status post cholecystectomy.
